# Patient Record
Sex: Female | Born: 1965 | Race: White | Hispanic: No | Marital: Married
Health system: Midwestern US, Academic
[De-identification: ages and names within clinical notes are randomized; demographics above are authoritative.]

---

## 2018-05-26 ENCOUNTER — Encounter: Admit: 2018-05-26 | Discharge: 2018-05-27 | Payer: Private Health Insurance - Indemnity | Primary: Family

## 2018-05-26 DIAGNOSIS — R69 Illness, unspecified: Principal | ICD-10-CM

## 2018-06-15 ENCOUNTER — Encounter: Admit: 2018-06-15 | Discharge: 2018-06-16 | Payer: Private Health Insurance - Indemnity | Primary: Family

## 2018-06-15 ENCOUNTER — Ambulatory Visit: Admit: 2018-06-15 | Discharge: 2018-06-15 | Payer: Private Health Insurance - Indemnity | Primary: Family

## 2018-06-15 ENCOUNTER — Encounter: Admit: 2018-06-15 | Discharge: 2018-06-15 | Payer: Private health insurance—other commercial Indemnity | Primary: Family

## 2018-06-15 DIAGNOSIS — R69 Illness, unspecified: Principal | ICD-10-CM

## 2018-06-22 ENCOUNTER — Encounter: Admit: 2018-06-22 | Discharge: 2018-06-22 | Payer: Private health insurance—other commercial Indemnity | Primary: Family

## 2018-06-22 DIAGNOSIS — C50912 Malignant neoplasm of unspecified site of left female breast: Principal | ICD-10-CM

## 2018-06-23 ENCOUNTER — Encounter: Admit: 2018-06-23 | Discharge: 2018-06-23 | Payer: Private health insurance—other commercial Indemnity | Primary: Family

## 2018-06-26 ENCOUNTER — Encounter: Admit: 2018-06-26 | Discharge: 2018-06-26 | Payer: Private Health Insurance - Indemnity | Primary: Family

## 2018-06-26 DIAGNOSIS — Z170 Estrogen receptor positive status [ER+]: ICD-10-CM

## 2018-06-26 DIAGNOSIS — C50112 Malignant neoplasm of central portion of left female breast: Principal | ICD-10-CM

## 2018-06-26 DIAGNOSIS — R69 Illness, unspecified: Principal | ICD-10-CM

## 2018-06-26 DIAGNOSIS — C50912 Malignant neoplasm of unspecified site of left female breast: ICD-10-CM

## 2018-06-29 ENCOUNTER — Encounter: Admit: 2018-06-29 | Discharge: 2018-06-29 | Payer: Private Health Insurance - Indemnity | Primary: Family

## 2018-06-29 DIAGNOSIS — C50919 Malignant neoplasm of unspecified site of unspecified female breast: Principal | ICD-10-CM

## 2018-06-30 ENCOUNTER — Ambulatory Visit: Admit: 2018-06-30 | Discharge: 2018-07-01 | Payer: Private health insurance—other commercial Indemnity | Primary: Family

## 2018-06-30 ENCOUNTER — Encounter: Admit: 2018-06-30 | Discharge: 2018-06-30 | Payer: Private Health Insurance - Indemnity | Primary: Family

## 2018-06-30 ENCOUNTER — Encounter: Admit: 2018-06-30 | Discharge: 2018-06-30 | Payer: Private health insurance—other commercial Indemnity | Primary: Family

## 2018-06-30 ENCOUNTER — Ambulatory Visit: Admit: 2018-06-30 | Discharge: 2018-06-30 | Payer: Private Health Insurance - Indemnity | Primary: Family

## 2018-06-30 DIAGNOSIS — Z01818 Encounter for other preprocedural examination: ICD-10-CM

## 2018-06-30 DIAGNOSIS — C50919 Malignant neoplasm of unspecified site of unspecified female breast: Principal | ICD-10-CM

## 2018-06-30 DIAGNOSIS — C50112 Malignant neoplasm of central portion of left female breast: Principal | ICD-10-CM

## 2018-06-30 DIAGNOSIS — G4700 Insomnia, unspecified: ICD-10-CM

## 2018-06-30 DIAGNOSIS — Z9189 Other specified personal risk factors, not elsewhere classified: ICD-10-CM

## 2018-06-30 DIAGNOSIS — C50912 Malignant neoplasm of unspecified site of left female breast: Principal | ICD-10-CM

## 2018-06-30 DIAGNOSIS — Z170 Estrogen receptor positive status [ER+]: ICD-10-CM

## 2018-06-30 DIAGNOSIS — J302 Other seasonal allergic rhinitis: ICD-10-CM

## 2018-06-30 DIAGNOSIS — M1990 Unspecified osteoarthritis, unspecified site: ICD-10-CM

## 2018-06-30 LAB — CBC AND DIFF
Lab: 0.1 10*3/uL (ref 0–0.20)
Lab: 0.1 10*3/uL (ref 0–0.45)
Lab: 0.8 10*3/uL (ref 0–0.80)
Lab: 14 g/dL (ref 12.0–15.0)
Lab: 2 10*3/uL (ref 1.8–7.0)
Lab: 3.7 10*3/uL (ref 1.0–4.8)
Lab: 30 pg — ABNORMAL HIGH (ref 26–34)
Lab: 4.8 M/UL (ref 4.0–5.0)
Lab: 43 % (ref 36–45)
Lab: 56 % — ABNORMAL HIGH (ref 24–44)
Lab: 90 FL (ref 80–100)

## 2018-06-30 LAB — COMPREHENSIVE METABOLIC PANEL
Lab: 139 MMOL/L (ref 137–147)
Lab: 3.8 MMOL/L (ref 3.5–5.1)

## 2018-06-30 NOTE — Progress Notes
Name: Patricia Montes          MRN: [id number]      DOB: 12-08-1965      AGE: 53 y.o.   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 is a caucasian female who presented to the [facility] on 06/30/2018 at age 53 for evaluation of left breast cancer. [name] had no complaints prior to her outside screening mammogram in March 2020. There was a new left breast mass in the central breast that was suspicious. This was suspicious on diagnostic imaging and biopsy was recommended. 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    BREAST IMAGING:  Mammogram:    -- Bilateral screening mammogram 05/26/18 ([place]) revealed scattered fibroglandular densities. On MLO view in the left breast at 12:00 there was a 6 mm asymmetric density lying roughly 3.5 cm FTN. This finding was not as conspicuous on CC view but still appeared to be present on tomographic images. The development of this density since the prior exam did make it worrisome for malignancy. Diagnostic imaging recommended. The right breast was unremarkable.  -- Left diagnostic mammogram 06/15/18 [facility]) revealed a persistent spiculated density in the upper left breast approximately 2-3 cm FTN. This was concerning for breast neoplasm. Further evaluation with ultrasound recommended.  -- Left diagnostic mammogram 06/30/18 ([place]) revealed fatty breast tissue. There was a 3.2 cm irregular spiculated mass in the 11:30 to 12:00

## 2018-06-30 NOTE — Patient Instructions
a snug sports bra until your post-operative appointment. Do NOT wear underwire bras.    SIGNS & SYMPTOMS  -Please contact your doctor if you have any of the following symptoms: temperature higher than 100 degrees F, uncontrolled pain, persistent nausea and/or vomiting, difficulty breathing or breast redness, leakage or incision breakdown/opening.     IF YOU HAVE A DRAIN  *WASH HANDS PRIOR TO ANY HANDLING OF DRAIN.   -Please write down daily (total for 24 hours) drain output and empty multiple times a day if needed or according to physician's instructions. Record the output in milliliters.  -There is a CHG drain dressing in place over the entry site of your drain. This is to remain in place until removed by your surgeon. There is an antimicrobial gel in the center rectangle of the dressing. This is normal and not a sign of fluid leaking. If there is physical liquid leaking on the skin outside the area of the drain, please notify your surgeons office.   -To empty the drain, open the lid on top of the bulb.  Pour the fluid into a measuring cup and record.  Close by squeezing the bulb until as much air as possible is removed, then cap the lid to recreate suction.   -Strip the drain twice daily or as needed by pinching the tubing near the skin with both hands.  Keep hand closest to you steady and with the other, slowly squeeze the liquid toward the bulb.  This will prevent any clots or debris from clogging the drain.   -It is okay to shower with the drain and dressing in place.  Pat the dressing dry after showering.    *When showering consider placing your back to the water and letting the water run over the surgical areas. Soap and water contact over the surgical sites is OK. Please do not soak or scrub the sites.     *A lanyard can be used to hook the drains around your neck to allow freedom of hands for showering.    -Please bring the drain record to your next clinic appointment. try taking additional laxatives.    *Magnesium Citrate (1-2 bottles in 24 hour period). Do not take if you have kidney issues or are on dialysis.       For questions or concerns regarding your hospital stay:  - DURING BUSINESS HOURS (8:00 AM - 4:30 PM):    Call [phone] to speak to your surgeon???s nurse    - AFTER BUSINESS HOURS (4:30 PM - 8:00 AM, on weekends, or holidays):  Call [phone] and ask to page the BREAST SURGERY team      IF A PLASTIC SURGEON WAS INVOLVED IN YOUR SURGERY:    - DURING BUSINESS HOURS (8:00 AM - 4:30 PM):    Call the office at [phone]    - AFTER BUSINESS HOURS (4:30 PM - 8:00 AM, on weekends, or holidays):  Call [phone] and ask to page the PLASTIC SURGERY team    *If you had reconstruction with a plastic surgeon, all pain medication refills will be completed by their office.

## 2018-07-03 ENCOUNTER — Encounter: Admit: 2018-07-03 | Discharge: 2018-07-03 | Payer: Private Health Insurance - Indemnity | Primary: Family

## 2018-07-03 DIAGNOSIS — G4700 Insomnia, unspecified: ICD-10-CM

## 2018-07-03 DIAGNOSIS — M1990 Unspecified osteoarthritis, unspecified site: ICD-10-CM

## 2018-07-03 DIAGNOSIS — J302 Other seasonal allergic rhinitis: ICD-10-CM

## 2018-07-03 DIAGNOSIS — C50919 Malignant neoplasm of unspecified site of unspecified female breast: Principal | ICD-10-CM

## 2018-07-06 ENCOUNTER — Encounter: Admit: 2018-07-06 | Discharge: 2018-07-06 | Payer: Private health insurance—other commercial Indemnity | Primary: Family

## 2018-07-06 ENCOUNTER — Encounter: Admit: 2018-07-06 | Discharge: 2018-07-06 | Payer: Private Health Insurance - Indemnity | Primary: Family

## 2018-07-10 ENCOUNTER — Encounter: Admit: 2018-07-10 | Discharge: 2018-07-10 | Payer: Private health insurance—other commercial Indemnity | Primary: Family

## 2018-07-10 ENCOUNTER — Encounter: Admit: 2018-07-10 | Discharge: 2018-07-10 | Payer: Private Health Insurance - Indemnity | Primary: Family

## 2018-07-10 DIAGNOSIS — C50112 Malignant neoplasm of central portion of left female breast: Principal | ICD-10-CM

## 2018-07-10 DIAGNOSIS — M1990 Unspecified osteoarthritis, unspecified site: ICD-10-CM

## 2018-07-10 DIAGNOSIS — G4700 Insomnia, unspecified: ICD-10-CM

## 2018-07-10 DIAGNOSIS — J302 Other seasonal allergic rhinitis: ICD-10-CM

## 2018-07-10 DIAGNOSIS — C50912 Malignant neoplasm of unspecified site of left female breast: Principal | ICD-10-CM

## 2018-07-10 DIAGNOSIS — C50919 Malignant neoplasm of unspecified site of unspecified female breast: Principal | ICD-10-CM

## 2018-07-10 NOTE — Progress Notes
???   acetaminophen SR (TYLENOL) 650 mg tablet Take 1,300 mg by mouth at bedtime daily. Tylenol Arthritis   ??? cetirizine HCl/pseudoephedrine (ZYRTEC-D PO) Take  by mouth daily.   ??? cyclobenzaprine (FLEXERIL) 10 mg tablet Take 10 mg by mouth every 8 hours.   ??? diphenhydramine HCl (BENADRYL PO) Take  by mouth as Needed.   ??? FLINTSTONES MULTIVITAMIN PO Take  by mouth daily.   ??? fluticasone propionate (FLONASE) 50 mcg/actuation nasal spray, suspension Apply 1 spray into nose as directed every 24 hours.   ??? guaifenesin (MUCUS RELIEF PO) Take  by mouth daily.   ??? mv-min-vit C-Glu-Lys ac-hb124 (AIRBORNE (WITH LYSINE ACETATE)) 250-12.5 mg chew Chew  by mouth daily.   ??? traZODone (DESYREL) 50 mg tablet Take 50 mg by mouth at bedtime as needed.     Vitals:    07/10/18 1352   Temp: 36.9 ???C (98.5 ???F)   TempSrc: Tympanic   Weight: 84.4 kg (186 lb)   Height: 163.8 cm (64.5)   PainSc: Zero     Body mass index is 31.43 kg/m???.     Pain Score: Zero       Fatigue Scale: 0-None    Pain Addressed:  N/A    Patient Evaluated for a Clinical Trial: Patient not eligible for a treatment trial (including not needing treatment, needs palliative care, in remission).     [place] Cooperative Oncology Group performance status is 0, Fully active, able to carry on all pre-disease performance without restriction.Marland Kitchen     Physical Exam  Constitutional:       Appearance: Normal appearance.   HENT:      Head: Normocephalic and atraumatic.   Pulmonary:      Effort: Pulmonary effort is normal.   Neurological:      Mental Status: She is alert.   Psychiatric:         Mood and Affect: Mood normal.               Assessment and Plan:        Encounter Diagnoses   Name Primary?   ??? Malignant neoplasm of left female breast, unspecified estrogen receptor status, unspecified site of breast (HCC) Yes          Left breast cancer  I discussed discussed prognosis and treatment options of HER-2 positive breast cancer with running and her family I recommended neoadjuvant

## 2018-07-13 ENCOUNTER — Encounter: Admit: 2018-07-13 | Discharge: 2018-07-13 | Payer: Private health insurance—other commercial Indemnity | Primary: Family

## 2018-07-13 ENCOUNTER — Encounter: Admit: 2018-07-13 | Discharge: 2018-07-13 | Payer: Private Health Insurance - Indemnity | Primary: Family

## 2018-07-13 DIAGNOSIS — C50112 Malignant neoplasm of central portion of left female breast: Principal | ICD-10-CM

## 2018-07-13 MED ORDER — CEFAZOLIN INJ 1GM IVP
2 g | Freq: Once | INTRAVENOUS | 0 refills | Status: CN
Start: 2018-07-13 — End: ?

## 2018-07-14 ENCOUNTER — Encounter: Admit: 2018-07-14 | Discharge: 2018-07-14 | Payer: Private Health Insurance - Indemnity | Primary: Family

## 2018-07-14 DIAGNOSIS — M1990 Unspecified osteoarthritis, unspecified site: ICD-10-CM

## 2018-07-14 DIAGNOSIS — J302 Other seasonal allergic rhinitis: ICD-10-CM

## 2018-07-14 DIAGNOSIS — C50919 Malignant neoplasm of unspecified site of unspecified female breast: Principal | ICD-10-CM

## 2018-07-14 DIAGNOSIS — G4700 Insomnia, unspecified: ICD-10-CM

## 2018-07-15 ENCOUNTER — Encounter: Admit: 2018-07-15 | Discharge: 2018-07-15 | Payer: Private Health Insurance - Indemnity | Primary: Family

## 2018-07-15 ENCOUNTER — Ambulatory Visit: Admit: 2018-07-15 | Discharge: 2018-07-15 | Payer: Private health insurance—other commercial Indemnity | Primary: Family

## 2018-07-15 ENCOUNTER — Ambulatory Visit: Admit: 2018-07-15 | Discharge: 2018-07-15 | Payer: Private Health Insurance - Indemnity | Primary: Family

## 2018-07-15 ENCOUNTER — Encounter: Admit: 2018-07-15 | Discharge: 2018-07-15 | Payer: Private health insurance—other commercial Indemnity | Primary: Family

## 2018-07-15 DIAGNOSIS — Z170 Estrogen receptor positive status [ER+]: ICD-10-CM

## 2018-07-15 DIAGNOSIS — Z452 Encounter for adjustment and management of vascular access device: Principal | ICD-10-CM

## 2018-07-15 DIAGNOSIS — G4700 Insomnia, unspecified: ICD-10-CM

## 2018-07-15 DIAGNOSIS — J302 Other seasonal allergic rhinitis: ICD-10-CM

## 2018-07-15 DIAGNOSIS — C50112 Malignant neoplasm of central portion of left female breast: ICD-10-CM

## 2018-07-15 DIAGNOSIS — M1990 Unspecified osteoarthritis, unspecified site: ICD-10-CM

## 2018-07-15 DIAGNOSIS — C50919 Malignant neoplasm of unspecified site of unspecified female breast: Principal | ICD-10-CM

## 2018-07-15 MED ORDER — LIDOCAINE (PF) 10 MG/ML (1 %) IJ SOLN
.1-2 mL | Freq: Once | INTRAMUSCULAR | 0 refills | Status: CP
Start: 2018-07-15 — End: ?

## 2018-07-15 MED ORDER — LACTATED RINGERS IV SOLP
INTRAVENOUS | 0 refills | Status: DC
Start: 2018-07-15 — End: 2018-07-15
  Administered 2018-07-15: 14:00:00 1000 mL via INTRAVENOUS

## 2018-07-15 MED ORDER — ONDANSETRON HCL (PF) 4 MG/2 ML IJ SOLN
4 mg | Freq: Once | INTRAVENOUS | 0 refills | Status: DC | PRN
Start: 2018-07-15 — End: 2018-07-15

## 2018-07-15 MED ORDER — LIDOCAINE (PF) 200 MG/10 ML (2 %) IJ SYRG
0 refills | Status: DC
Start: 2018-07-15 — End: 2018-07-15
  Administered 2018-07-15: 14:00:00 50 mg via INTRAVENOUS

## 2018-07-15 MED ORDER — HALOPERIDOL LACTATE 5 MG/ML IJ SOLN
1 mg | Freq: Once | INTRAVENOUS | 0 refills | Status: DC | PRN
Start: 2018-07-15 — End: 2018-07-15

## 2018-07-15 MED ORDER — HYDROCODONE-ACETAMINOPHEN 5-325 MG PO TAB
1 | ORAL_TABLET | ORAL | 0 refills | 30.00000 days | Status: DC | PRN
Start: 2018-07-15 — End: 2018-11-25
  Filled 2018-07-15 (×2): qty 10, 2d supply, fill #1

## 2018-07-15 MED ORDER — FENTANYL CITRATE (PF) 50 MCG/ML IJ SOLN
25 ug | INTRAVENOUS | 0 refills | Status: DC | PRN
Start: 2018-07-15 — End: 2018-07-15

## 2018-07-15 MED ORDER — LIDOCAINE HCL 10 MG/ML (1 %) IJ SOLN
0 refills | Status: DC
Start: 2018-07-15 — End: 2018-07-15
  Administered 2018-07-15: 14:00:00 4.5 mL via INTRAMUSCULAR

## 2018-07-15 MED ORDER — CEFAZOLIN INJ 1GM IVP
2 g | Freq: Once | INTRAVENOUS | 0 refills | Status: DC
Start: 2018-07-15 — End: 2018-07-15

## 2018-07-15 MED ORDER — FENTANYL CITRATE (PF) 50 MCG/ML IJ SOLN
50 ug | INTRAVENOUS | 0 refills | Status: DC | PRN
Start: 2018-07-15 — End: 2018-07-15
  Administered 2018-07-15: 15:00:00 50 ug via INTRAVENOUS

## 2018-07-15 MED ORDER — MEPERIDINE (PF) 25 MG/ML IJ SYRG
12.5 mg | INTRAVENOUS | 0 refills | Status: DC | PRN
Start: 2018-07-15 — End: 2018-07-15

## 2018-07-15 MED ORDER — SODIUM CHLORIDE 0.9 % IV SOLP
0 refills | Status: CP
Start: 2018-07-15 — End: ?
  Administered 2018-07-15: 14:00:00 1000 mL

## 2018-07-15 MED ORDER — PROPOFOL 10 MG/ML IV EMUL 20 ML (INFUSION)(AM)(OR)
0 refills | Status: DC
Start: 2018-07-15 — End: 2018-07-15

## 2018-07-15 MED ORDER — FENTANYL CITRATE (PF) 50 MCG/ML IJ SOLN
50 ug | INTRAVENOUS | 0 refills | Status: DC | PRN
Start: 2018-07-15 — End: 2018-07-15

## 2018-07-15 MED ORDER — DIPHENHYDRAMINE HCL 50 MG/ML IJ SOLN
25 mg | Freq: Once | INTRAVENOUS | 0 refills | Status: DC | PRN
Start: 2018-07-15 — End: 2018-07-15

## 2018-07-15 MED ORDER — MIDAZOLAM 1 MG/ML IJ SOLN
INTRAVENOUS | 0 refills | Status: DC
Start: 2018-07-15 — End: 2018-07-15
  Administered 2018-07-15: 14:00:00 2 mg via INTRAVENOUS

## 2018-07-15 MED ORDER — OXYCODONE 5 MG PO TAB
5-10 mg | Freq: Once | ORAL | 0 refills | Status: DC | PRN
Start: 2018-07-15 — End: 2018-07-15

## 2018-07-15 MED ORDER — BUPIVACAINE 0.25 % (2.5 MG/ML) IJ SOLN
0 refills | Status: DC
Start: 2018-07-15 — End: 2018-07-15
  Administered 2018-07-15: 14:00:00 4.5 mL via INTRAMUSCULAR

## 2018-07-15 MED ORDER — HEPARIN (PORCINE) 1,000 UNIT/ML IJ SOLN
0 refills | Status: DC
Start: 2018-07-15 — End: 2018-07-15
  Administered 2018-07-15: 14:00:00 3000 [IU] via INTRAVENOUS

## 2018-07-15 MED ADMIN — LIDOCAINE (PF) 10 MG/ML (1 %) IJ SOLN [95838]: 0.1 mL | INTRAMUSCULAR | @ 14:00:00 | Stop: 2018-07-15 | NDC 63323049227

## 2018-07-15 NOTE — Anesthesia Post-Procedure Evaluation
Post-Anesthesia Evaluation    Name: Patricia Montes      MRN: [id number]     DOB: November 04, 1965     Age: 53 y.o.     Sex: female   __________________________________________________________________________     Procedure Date: 07/15/2018  Procedure(s) (LRB):  PLACEMENT OF PORT-A-CATH 8 FRENCH SINGLE-LUMEN POWERPORT (Right)  FLUOROSCOPIC GUIDANCE CENTRAL VENOUS ACCESS DEVICE PLACEMENT (Right)      Surgeon: Surgeon(s):  Berbel, German L, DO    Post-Anesthesia Vitals  BP: 139/95 (04/22 1030)  Temp: 36.3 ???C (97.3 ???F) (04/22 1030)  Pulse: 73 (04/22 1030)  Respirations: 16 PER MINUTE (04/22 1030)  SpO2: 96 % (04/22 1030)  SpO2 Pulse: 75 (04/22 1030)  Height: 162.6 cm (64) (04/22 0801)   Vitals Value Taken Time   BP 139/95 07/15/2018 10:30 AM   Temp 36.3 ???C (97.3 ???F) 07/15/2018 10:30 AM   Pulse 73 07/15/2018 10:30 AM   Respirations 16 PER MINUTE 07/15/2018 10:30 AM   SpO2 96 % 07/15/2018 10:30 AM         Post Anesthesia Evaluation Note    Evaluation location: pre/post  Patient participation: recovered; patient participated in evaluation  Level of consciousness: alert    Pain score: 2  Pain management: adequate    Hydration: normovolemia  Temperature: 36.0???C - 38.4???C  Airway patency: adequate    Perioperative Events       Post-op nausea and vomiting: no PONV    Postoperative Status  Cardiovascular status: hemodynamically stable  Respiratory status: spontaneous ventilation        Perioperative Events  Perioperative Event: No  Emergency Case Activation: No

## 2018-07-16 ENCOUNTER — Encounter: Admit: 2018-07-16 | Discharge: 2018-07-16 | Payer: Private Health Insurance - Indemnity | Primary: Family

## 2018-07-16 DIAGNOSIS — C50919 Malignant neoplasm of unspecified site of unspecified female breast: Principal | ICD-10-CM

## 2018-07-16 DIAGNOSIS — G4700 Insomnia, unspecified: ICD-10-CM

## 2018-07-16 DIAGNOSIS — M1990 Unspecified osteoarthritis, unspecified site: ICD-10-CM

## 2018-07-16 DIAGNOSIS — J302 Other seasonal allergic rhinitis: ICD-10-CM

## 2018-07-23 ENCOUNTER — Encounter: Admit: 2018-07-23 | Discharge: 2018-07-23 | Payer: Private Health Insurance - Indemnity | Primary: Family

## 2018-08-19 ENCOUNTER — Encounter: Admit: 2018-08-19 | Discharge: 2018-08-19 | Payer: Private Health Insurance - Indemnity | Primary: Family

## 2018-08-19 DIAGNOSIS — C50112 Malignant neoplasm of central portion of left female breast: Principal | ICD-10-CM

## 2018-08-19 DIAGNOSIS — Z170 Estrogen receptor positive status [ER+]: ICD-10-CM

## 2018-08-20 ENCOUNTER — Encounter: Admit: 2018-08-20 | Discharge: 2018-08-20 | Payer: Private Health Insurance - Indemnity | Primary: Family

## 2018-08-20 NOTE — Progress Notes
Certified Genetic Counselor  [facility]  [address] [place], [place]  [address]    Email: [email or web address]  Telephone [phone]  Fax:  [phone]  Appointments [phone]    Pending: Appointment to return to clinic will be pursued as appropriate.

## 2018-09-30 ENCOUNTER — Encounter: Admit: 2018-09-30 | Discharge: 2018-09-30 | Primary: Family

## 2018-09-30 DIAGNOSIS — C50112 Malignant neoplasm of central portion of left female breast: Secondary | ICD-10-CM

## 2018-09-30 DIAGNOSIS — G4700 Insomnia, unspecified: Secondary | ICD-10-CM

## 2018-09-30 DIAGNOSIS — M1990 Unspecified osteoarthritis, unspecified site: Secondary | ICD-10-CM

## 2018-09-30 DIAGNOSIS — C50919 Malignant neoplasm of unspecified site of unspecified female breast: Secondary | ICD-10-CM

## 2018-09-30 DIAGNOSIS — Z170 Estrogen receptor positive status [ER+]: Secondary | ICD-10-CM

## 2018-09-30 DIAGNOSIS — J302 Other seasonal allergic rhinitis: Secondary | ICD-10-CM

## 2018-09-30 NOTE — Progress Notes
Telehealth Visit Note    Date of Service: 09/30/2018    Subjective:      Obtained patient's verbal consent to treat them and their agreement to [facility] financial policy and NPP via this telehealth visit during the [facility] Emergency       Patricia Montes is a 53 y.o. female.    DIAGNOSIS:  Left grade 3 IDC (ER93%, PR41%, HER2 2+, FISH positive, Ki-67 65%) at 12:00, dx 05/2018 (markers were rerun at [facility] and [facility] was positive at [place] and negative on initial outside slides)    History of Present Illness    Patricia Montes returns to the clinic midway through neoadjuvant chemotherapy for surgical discussion.     HISTORY:  Patricia Montes is a caucasian female who presented to the [facility] on 06/30/2018 at age 58 for evaluation of left breast cancer. Patricia Montes had no complaints prior to her outside screening mammogram in March 2020. There was a new left breast mass in the central breast that was suspicious. This was suspicious on diagnostic imaging and biopsy was recommended. 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    BREAST IMAGING:  Mammogram:    -- Bilateral screening mammogram 05/26/18 ([place]) revealed scattered fibroglandular densities. On MLO view in the left breast at 12:00 there was a 6 mm asymmetric density lying roughly 3.5 cm FTN. This finding was not as conspicuous on CC view but still appeared to be present on tomographic images. The development of this density since the prior exam did make it worrisome for malignancy. Diagnostic imaging recommended. The right breast was unremarkable.  -- Left diagnostic mammogram 06/15/18 [facility]) revealed a persistent spiculated density in the upper left breast approximately 2-3 cm FTN. This was concerning for breast neoplasm. Further evaluation with ultrasound recommended.  -- Left diagnostic mammogram 06/30/18 ([place]) revealed fatty breast tissue. There was a 3.2 cm irregular spiculated mass in the 11:30 to 12:00  anterior right breast. This mass had associated calcifications and overlying skin and nipple retraction. The calcifications and mass measured up to 4.6 cm. Calcifications extended to approximately 9 mm posterior to the left nipple. Scattered calcifications in the posterior left breast on the spot magnification cc view are within the far inferior left breast on outside images and associated with postreduction change in this patient.    Ultrasound:    -- Left breast ultrasound 06/15/18 [facility]) revealed an irregular hypoechoic mass like region at 12:00, 2 cm FTN. This measured 1.9 x 0.9 x 1.5 cm. This did show internal vascularity. No other breast lesions. The largest node in the axilla measured 1.3 x 0.8 x 1.2 cm and did have a fatty hilum.  -- Targeted left breast ultrasound 06/30/18 ([place]) revealed at 12:00, 2 cm from the nipple, demonstrated a 1.8 x 0.9 x 2.3 cm mass which extended to 0.4 cm from the left nipple. This corresponds to biopsy-proven malignancy. The tissue marker clip was visible at the margin of the mass. No suspicious left axillary lymph nodes were seen. 4 morphologically normal left axillary lymph nodes were identified.    REPRODUCTIVE HEALTH:  Age at first Menarche: 75   Age at First Live Birth:  56  Age at Menopause:  Postmenopausal s/p hysterectomy  Gravida:  4  Para: 3  Breastfeeding:  Yes    PROCEDURE: Bilateral breast reduction  PERTINENT PMH:  Seasonal allergies, passive smoke exposure  FAMILY HISTORY:  Maternal great grandmother with breast cancer at  82  PHYSICAL EXAM on PRESENTATION:  Left - 4 cm palpable mass at 12:00, nipple retraction that is exacerbated in the seated position with arms in flexion. Right - No palpable breast masses. No skin, nipple, or areolar change. No supraclavicular or axillary adenopathy.  MEDICAL ONCOLOGY:  Dr. Lysle Morales NEOADJUVANT THERAPY: [facility] scheduled to finish 11/09/18 REFERRED BY:  Dr. Nathanial Millman         Review of Systems    Constitutional: Negative for fever, chills, appetite change and fatigue.   HENT: Negative for hearing loss, congestion, rhinorrhea and tinnitus.    Eyes: Negative for pain, discharge and itching.   Respiratory: Negative for cough, chest tightness and shortness of breath.    Cardiovascular: Negative for chest pain and palpitations.   Gastrointestinal: Negative for abdominal distention, pain, nausea, vomiting, and diarrhea.   Genitourinary: Negative for frequency, vaginal bleeding, difficulty urinating and pelvic pain.   Musculoskeletal: Negative for myalgias, back pain, joint swelling and arthralgias.   Skin: Negative for rash.   Neurological: Negative for dizziness, weakness, light-headedness and headaches.   Hematological: Does not bruise/bleed easily.   Psychiatric/Behavioral: Negative for disturbed wake/sleep cycle. The patient is not nervous/anxious.    No Known Allergies    Medical History:   Diagnosis Date   ??? Breast cancer (HCC) 06/15/2018    left IDC   ??? Insomnia    ??? Osteoarthritis    ??? Seasonal allergies      Surgical History:   Procedure Laterality Date   ??? CLAVICLE SURGERY  1992   ??? PLACEMENT OF PORT-A-CATH 8 FRENCH SINGLE-LUMEN POWERPORT Right 07/15/2018    Performed by Janine Ores L, DO at [facility]   ??? FLUOROSCOPIC GUIDANCE CENTRAL VENOUS ACCESS DEVICE PLACEMENT Right 07/15/2018    Performed by Dellia Cloud, DO at [facility]   ??? HX BREAST REDUCTION     ??? HX HYSTERECTOMY       Family History   Problem Relation Age of Onset   ??? Unknown to Patient Mother    ??? Heart Disease Father    ??? High Cholesterol Father    ??? Seizures Brother    ??? Arthritis-rheumatoid Maternal Aunt    ??? Cancer Maternal Uncle         non Hodgkins   ??? Arthritis-rheumatoid Maternal Grandmother    ??? Cancer-Breast Other 82        mat great gma   ??? Heart Disease Other    ??? Arthritis-rheumatoid Other    ??? High Cholesterol Other      Social History     Socioeconomic History ??? Marital status: Married     Spouse name: Not on file   ??? Number of children: Not on file   ??? Years of education: Not on file   ??? Highest education level: Not on file   Occupational History   ??? Not on file   Tobacco Use   ??? Smoking status: Passive Smoke Exposure - Never Smoker   ??? Smokeless tobacco: Never Used   Substance and Sexual Activity   ??? Alcohol use: Not Currently     Frequency: Never   ??? Drug use: Not Currently   ??? Sexual activity: Not on file   Other Topics Concern   ??? Not on file   Social History Narrative   ??? Not on file           Objective:         ??? acetaminophen SR (TYLENOL) 650 mg tablet  Take 1,300 mg by mouth at bedtime daily. Tylenol Arthritis   ??? cetirizine (ZYRTEC) 10 mg tablet Take 10 mg by mouth every morning.   ??? cyclobenzaprine (FLEXERIL) 10 mg tablet Take 10 mg by mouth every 8 hours.   ??? dexAMETHasone (DECADRON) 4 mg tablet    ??? diphenhydrAMINE (BENADRYL ALLERGY) 25 mg tablet Take 25 mg by mouth every 6 hours as needed.   ??? FLINTSTONES MULTIVITAMIN PO Take 2 tablets by mouth daily.   ??? fluticasone propionate (FLONASE) 50 mcg/actuation nasal spray, suspension Apply 1 spray into nose as directed every 24 hours.   ??? guaiFENesin LA (MUCINEX) 600 mg tablet Take 600 mg by mouth twice daily as needed.   ??? HYDROcodone/acetaminophen (NORCO) 5/325 mg tablet Take one tablet by mouth every 4 hours as needed for Pain   ??? lisinopriL (ZESTRIL) 5 mg tablet TK 1 TABLET PO DAILY   ??? mv-min-vit C-Glu-Lys ac-hb124 (AIRBORNE (WITH LYSINE ACETATE)) 250-12.5 mg chew Chew  by mouth daily.   ??? ondansetron (ZOFRAN) 8 mg tablet    ??? pantoprazole DR (PROTONIX) 20 mg tablet Take 20 mg by mouth daily.   ??? traZODone (DESYREL) 50 mg tablet Take 50 mg by mouth at bedtime as needed.     Vitals:    09/30/18 1020   BP: 116/61   BP Source: Arm, Left Upper   Patient Position: Sitting   Temp: 37.1 ???C (98.8 ???F)   TempSrc: Tympanic   Weight: 87.5 kg (193 lb)   Height: 162.6 cm (64)   PainSc: Zero Body mass index is 33.13 kg/m???.     Physical Exam    General: Well-groomed female. Sitting in chair in living room. She is able to move without difficulty. Facial movements are symmetric, no rashes noted. She appears to be in no acute distress. Eyes: No discharge or scleral icterus. Pulmonary: No respiratory distress. Psychiatric: Normal mood affect, memory recent and remote normal. Neuro: nonfocal. 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 Lysle Morales. She reports she has been tolerating chemotherapy without complaints. She started on 07/27/18 and is estimated to finish on 11/09/18. She reports having staging studies and an MRI prior to starting chemotherapy. She thought it was an abdominal MRI. I do not see any mention of scans or an MRI in Dr. Carey Bullocks notes, so I will contact his office for further follow up. When Patricia Montes met with Dr. Loreta Ave she wanted to move forward with mastectomy and was leaning towards no reconstruction. After more thought, she has decided she would like to move forward with reconstruction. We will plan for a left skin sparing mastectomy. She is not a nipple sparing candidate as her tumor was extending to the nipple, causing inversion and retraction. The details of skin sparing mastectomy with reconstruction were discussed including removal of the NAC and paresthesia. We discussed that reconstruction is a phased process that includes multiple surgeries. We will start the reconstruction process at the time of mastectomy. Patricia Montes is leaning towards DIEP reconstruction, so likely a tissue expander will be placed with her mastectomy. Dr. Loreta Ave had discussed that she may need PMR T pending her final pathology because her tumor was close to 5 cm.  I will wait to refer her to radiation oncology until we confirm that she will actually need radiation. A left SLNB at the time of mastectomy is recommended to evaluate for nodal metastasis and an ALND if a SLN is found to be positive. We discussed the risk of lymphedema associated with nodal surgery. She will be seen in the Lymphedema Clinic pre op for education. Patricia Montes asked if she would need any end of treatment imaging. Since she is having a mastectomy, I do not believe she will need any imaging, but will confirm with Dr. Loreta Ave. If she has not had a pre treatment breast MRI, I do not think this will be beneficial to perform at the end of treatment as it will not change our surgical plans. Patricia Montes is to follow up with Dr. Loreta Ave at the completion of chemotherapy for surgical discussion. She was given ample time to ask questions all of which were answered to her satisfaction. She was encouraged to call with any interval questions or concerns.    1. Plan for left skin sparing mastectomy/SLNB/pALND/recon with PRS  2. Continue follow up with Dr. Lysle Morales  3. Follow up with Dr. Lysle Morales about any imaging done  4. Possible PMR T - will refer to Dr. Izola Price post op if needed  5. Lymphedema telehealth consult for education  6. Follow up with Dr. Loreta Ave at completion of chemotherapy    Guy Begin, PA-C                                   35 minutes spent on this patient's encounter with counseling and coordination of care taking >50% of the visit.

## 2018-09-30 NOTE — Progress Notes
Valerie gave verbal report of self obtained vital signs prior to Telehealth appointment. Medications, allergies, history and screening questions reviewed with patient. Patient confirms she received the financial policy, consent to treat and notice of privacy policies and verbally agrees to the policies they contain.

## 2018-10-07 ENCOUNTER — Encounter: Admit: 2018-10-07 | Discharge: 2018-10-07 | Primary: Family

## 2018-10-07 DIAGNOSIS — Z170 Estrogen receptor positive status [ER+]: Secondary | ICD-10-CM

## 2018-10-07 DIAGNOSIS — C50112 Malignant neoplasm of central portion of left female breast: Secondary | ICD-10-CM

## 2018-10-07 NOTE — Progress Notes
Obtained patient's verbal consent to treat them and their agreement to [facility] financial policy and NPP via this telehealth visit during the [facility] Emergency    Patient is completing Neoadjuvant chemotherapy, with anticipated surgery Left Mastectomy/SLNB date of December 09, 2018.   We discussed This would put her in the 'Low RISK' category for developing Lymphedema    LYMPHEDEMA DATASHEET-BASELINE    DATE:  10/07/2018    PATIENT NAME:   Patricia Montes  DATE OF BIRTH:   [birth year]  MRN:   [id number]    Enrollment Visit Type:  Pre-Operative    BASELINE DIAGNOSTIC ASSESSMENT      BASELINE MEASUREMENTS  BIS = 3.5 LEFT (06/30/18)    Handedness:  right handed    Medical Team:   Surgeon: Loreta Ave  Plastic surgeon: tbd  Medical oncologist: Elia/Mathew  Radiation oncologist: tbd    The patient presents today for pre-operative lymphedema education/evaluation.     Risk factors for the development of breast cancer treatment related lymphedema include:   1.  Left mastectomy lumpectomy   2.  Left SLNBX/pALND   3. Radiation Therapy  4. Chemotherapy   5. BMI > 30   6. Other medical comorbidity:     Lymphedema evaluation, teaching and care plan:     1. General review of Lymphedema pathophysiology in breast cancer patients. Patient reports no previous history or experience/personal knowledge of lymphedema. No barriers to learning noted, patient willing and active participant.  Very interested in learning about topic, prevention.  Thoughtful questions demonstrated good insight into etiology and prevention.      2. Baseline assessment and measurements completed including:  L-Dex Bioimpedence test (SOZO),     3. Signs and Symptoms to watch for once initial symptoms from surgery have resolved reviewed including but not limited to:  achiness, tingling, discomfort, or increased warmth in the hand, arm, chest, breast, or underarm areas. Feelings of fullness or heaviness in the hand, arm, chest, breast, or underarm. Tightness or decreased flexibility in nearby joints, such as the shoulder, hand, or wrist. Trouble fitting the arm into a jacket or shirt sleeve that fit well before. Difficulty getting watches, rings, or bracelets on and off    4. Risk Factors:  BMI, Obesity and weight gain.  Optimal weight management is ideal for overall health. Discussed current BMI.States with chemotherapy, not tasting foods, but does taste sugar. Discussed nutrition is important for healing.  Nutritional resources available if needed.  Exercise when approved by surgeon maybe resumed.  Exercise is beneficial as well as the benefit of upper body strengthening.  Precautions regarding meticulous skin care reviewed.  This includes but not limited to sunscreen, burns, gardening, manicures, cuts, scrapes, signs of infection.  Avoidance of affect arm  needlestick's, blood pressures, finger sticks and other applicable prevention discussion completed.     5. When to call the office:  Sudden swelling or swelling that appears very quickly. Hand, arm, or other body parts seem to suddenly ???blow up??? may necessitate an evaluation.     6. RTC : Routine follow up with BIS surveillance. BIS to be coordinated with routine surgical follow ups.  Written handouts regarding covered material provided via e-mail prior to today's visit.    Total time 46 minutes.TIME in 9:20 Time out 1008 Estimated counseling time 40 minutes regarding signs symptoms, risk factors, monitoring, when to contact us and follow up plan.  Patient agreeable to follow up plan.     Provided patient with contact information for  follow up should questions or concerns arise.

## 2018-10-21 ENCOUNTER — Encounter: Admit: 2018-10-21 | Discharge: 2018-10-21 | Primary: Family

## 2018-10-21 DIAGNOSIS — C50112 Malignant neoplasm of central portion of left female breast: Secondary | ICD-10-CM

## 2018-10-21 NOTE — Telephone Encounter
Called patient to give her plastic surgery consult date with Dr. Rolanda Jay of 11/09/18 at 10:15am at [facility] and surgery date of 12/09/18 with Dr. Earleen Newport and Dr. Rolanda Jay. Patient is very happy with both of these dates. Patient inquires about getting genetic testing completed before surgery to ensure her surgical plan doesn't need to change. Educated patient that orders were already in for the genetic testing and that she could call Pilar Jarvis, who was her genetic counselor she met with in May 2020, to let her know that she'd like to proceed with testing. Discussed that if she had the testing completed soon, there would be plenty of time for results before surgery. Patient verbalizes understanding.  All questions answered. Patient given contact information for additional questions or concerns.

## 2018-10-22 NOTE — Progress Notes
Surgery is planned for 12-09-18   Request will be sent to see her for a 2 week post op.

## 2018-11-06 ENCOUNTER — Encounter: Admit: 2018-11-06 | Discharge: 2018-11-06 | Primary: Family

## 2018-11-06 NOTE — Progress Notes
Obtained patient's verbal consent to treat them and their agreement to [place] and NPP via this telehealth visit during the [facility] Emergency    Patricia Montes was called today on her request to follow up on her decision to pursue genetic testing.     Plan:  Blood will be drawn on 11-10-2018 and sent to [facility] for CancerNext Panel: 36 genes with susceptibility to cancer including: APC, ATM, AXIN2, BARD1, BRCA1, BRCA2, BRIP1, BMPR1A, CDH1, CDK4, CDKN2A, CHEK2, DICER1, EPCAM, GREM1, HOXB13, MLH1, MSH2, MSH3, MSH6, MUTYH, NBN, NF1, NTHL1, PALB2, PMS2, POLD1, POLE, PTEN, RAD51C, RAD51D, RECQL, SMAD4, SMARCA4, STK11, and TP53. This panel includes genes associated with cancer of the uterus, ovary, breast, colon, as well as colon polyps and other genes conferring susceptibility to hereditary cancer. Full gene sequencing is performed for 32 genes (excluding EPCAM and GREM1). Added genes RINT1 and PRF163W. RNA transcripts are screened for 18 genes (APC, ATM, BRCA1, BRCA2, BRIP1, CDH1, CHEK2, MLH1, MSH2, MSH6, MUTYH, NF1, PALB2,PMS2 exons 1-10, PTEN, RAD51C, RAD51D, and TP53) and compared to a human reference pool. The absence or presence of RNA transcriptsmeeting quality thresholds are incorporated as evidence towards assessment and classification of DNA variants. Any regions not meeting RNAquality thresholds are excluded from analysis.    Obtained verbal consent for the test, and discussed insurance and Out of pocket information.     Hassan Rowan, MS, [facility]   Certified Genetic Counselor  [place] [facility]  [address] [place], [place]  [place], [place]  [address]    Email: [email or web address]  Telephone [phone]  Fax:  [phone]  Appointments [phone]    Pending: Follow up in 3-4 weeks to discuss genetic test results.

## 2018-11-10 ENCOUNTER — Encounter: Admit: 2018-11-10 | Discharge: 2018-11-10 | Payer: Private Health Insurance - Indemnity | Primary: Family

## 2018-11-10 ENCOUNTER — Ambulatory Visit: Admit: 2018-11-10 | Discharge: 2018-11-10 | Payer: Private Health Insurance - Indemnity | Primary: Family

## 2018-11-10 DIAGNOSIS — Z170 Estrogen receptor positive status [ER+]: Secondary | ICD-10-CM

## 2018-11-10 DIAGNOSIS — C50112 Malignant neoplasm of central portion of left female breast: Secondary | ICD-10-CM

## 2018-11-17 ENCOUNTER — Encounter: Admit: 2018-11-17 | Discharge: 2018-11-17 | Primary: Family

## 2018-11-17 DIAGNOSIS — M1990 Unspecified osteoarthritis, unspecified site: Secondary | ICD-10-CM

## 2018-11-17 DIAGNOSIS — Z170 Estrogen receptor positive status [ER+]: Secondary | ICD-10-CM

## 2018-11-17 DIAGNOSIS — Z01818 Encounter for other preprocedural examination: Principal | ICD-10-CM

## 2018-11-17 DIAGNOSIS — J302 Other seasonal allergic rhinitis: Secondary | ICD-10-CM

## 2018-11-17 DIAGNOSIS — G4700 Insomnia, unspecified: Secondary | ICD-10-CM

## 2018-11-17 DIAGNOSIS — C50112 Malignant neoplasm of central portion of left female breast: Secondary | ICD-10-CM

## 2018-11-17 DIAGNOSIS — C50919 Malignant neoplasm of unspecified site of unspecified female breast: Secondary | ICD-10-CM

## 2018-11-17 LAB — CBC AND DIFF
Lab: 0 10*3/uL (ref 0–0.20)
Lab: 3.6 M/UL — ABNORMAL LOW (ref 4.0–5.0)
Lab: 9.7 10*3/uL (ref 4.5–11.0)

## 2018-11-17 NOTE — Progress Notes
Spoke with Ms. Younkins regarding L SSM IOLM SLNB pALND and the confirmed surgery date of 12/09/18.  The patient was informed that she would receive a call from the surgery department the day prior to surgery to confirm time of arrival.  The patient was given detailed instructions about where and when to check in the day of surgery.    A pre op packet describing the surgical procedure and pre and post operative instructions has been given to the patient and reviewed in detail. Informed pt that we will see her 1-2 weeks post op to review final pathology report in detail and assess surgical recovery.     The patient has been informed of the medications that need to be stopped 7-10 days prior to surgery and the NPO requirements starting at midnight the night before surgery. Also informed that a driver will need to accompany pt due to the influence of anesthesia including narcotics post procedure. Informed pt that the PAT department will call to discuss lab work, medication review, health history, anesthesia/ surgical history, and any other additional recommended testing for clearance for surgery.     The patient verbalized understanding of the information given.  The patient was encouraged to call with any questions or concerns.

## 2018-11-17 NOTE — Progress Notes
Bioimpedance Spectroscopy performed.  Advised patient that Normal result will be sent within 24 hours by mail or via Mychart (preferred).  The patient will be contacted via phone by the lymphedema nurse with any abnormal results.

## 2018-11-17 NOTE — Progress Notes
BIS obtained for Post Neo Adjuvant Chemotherapy measurements prior to surgery.  (NC)  BIS =  L-Dex:4.1 LEFT  Baseline3.5  Change from Baseline:0.6  No notification indicated

## 2018-11-18 ENCOUNTER — Encounter: Admit: 2018-11-18 | Discharge: 2018-11-18 | Primary: Family

## 2018-11-18 DIAGNOSIS — C50112 Malignant neoplasm of central portion of left female breast: Secondary | ICD-10-CM

## 2018-11-18 DIAGNOSIS — Z1159 Encounter for screening for other viral diseases: Secondary | ICD-10-CM

## 2018-11-18 LAB — COMPREHENSIVE METABOLIC PANEL
Lab: 0.4 mg/dL (ref 0.3–1.2)
Lab: 1 mg/dL — ABNORMAL HIGH (ref 0.4–1.00)
Lab: 104 MMOL/L (ref 98–110)
Lab: 139 MMOL/L — ABNORMAL LOW (ref 137–147)
Lab: 18 U/L (ref 7–40)
Lab: 27 MMOL/L (ref 21–30)
Lab: 27 U/L (ref 7–56)
Lab: 3.9 MMOL/L — ABNORMAL LOW (ref 3.5–5.1)
Lab: 35 mg/dL — ABNORMAL HIGH (ref 7–25)
Lab: 4.3 g/dL (ref 3.5–5.0)
Lab: 53 mL/min — ABNORMAL LOW (ref >60)
Lab: 7.3 g/dL (ref 6.0–8.0)
Lab: 8 K/UL (ref 3–12)
Lab: 83 U/L (ref 25–110)
Lab: 9.5 mg/dL (ref 8.5–10.6)
Lab: >60 mL/min (ref >60)

## 2018-11-18 MED ORDER — CEFAZOLIN INJ 1GM IVP
2 g | Freq: Once | INTRAVENOUS | 0 refills | Status: CN
Start: 2018-11-18 — End: ?

## 2018-11-20 ENCOUNTER — Encounter: Admit: 2018-11-20 | Discharge: 2018-11-20 | Primary: Family

## 2018-11-23 ENCOUNTER — Encounter: Admit: 2018-11-23 | Discharge: 2018-11-23 | Primary: Family

## 2018-11-23 ENCOUNTER — Ambulatory Visit: Admit: 2018-11-23 | Discharge: 2018-11-24 | Primary: Family

## 2018-11-23 DIAGNOSIS — J302 Other seasonal allergic rhinitis: Secondary | ICD-10-CM

## 2018-11-23 DIAGNOSIS — C50919 Malignant neoplasm of unspecified site of unspecified female breast: Secondary | ICD-10-CM

## 2018-11-23 DIAGNOSIS — M1990 Unspecified osteoarthritis, unspecified site: Secondary | ICD-10-CM

## 2018-11-23 DIAGNOSIS — G4700 Insomnia, unspecified: Secondary | ICD-10-CM

## 2018-11-23 DIAGNOSIS — Z421 Encounter for breast reconstruction following mastectomy: Secondary | ICD-10-CM

## 2018-11-23 DIAGNOSIS — C50112 Malignant neoplasm of central portion of left female breast: Secondary | ICD-10-CM

## 2018-11-23 NOTE — Progress Notes
Date of Service: 11/23/2018                    History of Present Illness  Patricia Montes is a 53 y.o. female who presented to the [facility] on 06/30/2018 for evaluation of left breast cancer. She felt a left breast mass in January, thought it was a bruised.  On her outside screening mammogram in March 2020. There was a new left breast mass in the central breast that was suspicious. This was suspicious on diagnostic imaging and biopsy was recommended. 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  She had breast reduction surgery done 18 years ago.  Healed well, left with some size discrepancy with the left larger.  She a C or D cup size.  She is to undergo a left mastectomy.  ???  Age at first Menarche: 66   Age at First Live Birth:  44  Age at Menopause:  Postmenopausal s/p hysterectomy  Gravida:  4  Para: 3  Breastfeeding:  Yes  ???  Seasonal allergies, passive smoke exposure-husband is a smoker.  Maternal great grandmother with breast cancer at 40  Dr. Lysle Morales- [facility] finished 11/09/18  REFERRED BY:  Dr. Nathanial Millman       Review of Systems   Constitutional: Negative.    HENT: Negative.    Eyes: Negative.    Respiratory: Negative.    Cardiovascular: Negative.    Gastrointestinal: Negative.    Endocrine: Negative.    Genitourinary: Negative.    Musculoskeletal: Negative.    Skin: Negative.    Allergic/Immunologic: Positive for environmental allergies.   Neurological: Positive for numbness.   Hematological: Negative.    Psychiatric/Behavioral: Negative.        Medical History:   Diagnosis Date   ??? Breast cancer (HCC) 06/15/2018    left IDC   ??? Insomnia    ??? Osteoarthritis    ??? Seasonal allergies         Surgical History:   Procedure Laterality Date   ??? CLAVICLE SURGERY  1992   ??? PLACEMENT OF PORT-A-CATH 8 FRENCH SINGLE-LUMEN POWERPORT Right 07/15/2018    Performed by Janine Ores L, DO at [facility]   ??? FLUOROSCOPIC GUIDANCE CENTRAL VENOUS ACCESS DEVICE PLACEMENT Right 07/15/2018    Performed by Dellia Cloud, DO at [facility]   ??? HX BREAST REDUCTION     ??? HX HYSTERECTOMY          Family History   Problem Relation Age of Onset   ??? Unknown to Patient Mother    ??? Heart Disease Father    ??? High Cholesterol Father    ??? Seizures Brother    ??? Arthritis-rheumatoid Maternal Aunt    ??? Cancer Maternal Uncle         non Hodgkins   ??? Arthritis-rheumatoid Maternal Grandmother    ??? Cancer-Breast Other 82        mat great gma   ??? Heart Disease Other    ??? Arthritis-rheumatoid Other    ??? High Cholesterol Other         Social History     Socioeconomic History   ??? Marital status: Married     Spouse name: Not on file   ??? Number of children: Not on file   ??? Years of education: Not on file   ??? Highest education level: Not on file   Occupational History   ??? Not on file  Tobacco Use   ??? Smoking status: Passive Smoke Exposure - Never Smoker   ??? Smokeless tobacco: Never Used   Substance and Sexual Activity   ??? Alcohol use: Not Currently     Frequency: Never   ??? Drug use: Not Currently   ??? Sexual activity: Not on file   Other Topics Concern   ??? Not on file   Social History Narrative   ??? Not on file          Objective:         ??? acetaminophen SR (TYLENOL) 650 mg tablet Take 1,300 mg by mouth at bedtime daily. Tylenol Arthritis   ??? BENZONATATE PO Take  by mouth at bedtime as needed.   ??? cetirizine (ZYRTEC) 10 mg tablet Take 10 mg by mouth every morning.   ??? codeine/guaiFENesin (ROBITUSSIN-AC) 10/100 mg/5 mL oral solution Take 5 mL by mouth at bedtime as needed for Cough.   ??? cyclobenzaprine (FLEXERIL) 10 mg tablet Take 10 mg by mouth every 8 hours.   ??? dexAMETHasone (DECADRON) 4 mg tablet    ??? diphenhydrAMINE (BENADRYL ALLERGY) 25 mg tablet Take 25 mg by mouth every 6 hours as needed.   ??? FLINTSTONES MULTIVITAMIN PO Take 2 tablets by mouth daily.   ??? fluticasone propionate (FLONASE) 50 mcg/actuation nasal spray, suspension Apply 1 spray into nose as directed every 24 hours. ??? guaiFENesin LA (MUCINEX) 600 mg tablet Take 600 mg by mouth twice daily as needed.   ??? HYDROcodone/acetaminophen (NORCO) 5/325 mg tablet Take one tablet by mouth every 4 hours as needed for Pain   ??? lisinopriL (ZESTRIL) 5 mg tablet TK 1 TABLET PO DAILY   ??? mv-min-vit C-Glu-Lys ac-hb124 (AIRBORNE (WITH LYSINE ACETATE)) 250-12.5 mg chew Chew  by mouth daily.   ??? ondansetron (ZOFRAN) 8 mg tablet    ??? pantoprazole DR (PROTONIX) 20 mg tablet Take 20 mg by mouth daily.   ??? traZODone (DESYREL) 50 mg tablet Take 50 mg by mouth at bedtime as needed.     Vitals:    11/23/18 1443   BP: (!) 140/80   BP Source: Arm, Left Upper   Patient Position: Sitting   Pulse: 85   Temp: 36.5 ???C (97.7 ???F)   TempSrc: Temporal   SpO2: 98%   Weight: 88.9 kg (196 lb)   Height: 165.1 cm (65)   PainSc: Zero     Body mass index is 32.62 kg/m???.  Body surface area is 2.02 meters squared.          Physical Exam  Vitals signs and nursing note reviewed.   Constitutional:       Appearance: Normal appearance.   HENT:      Head: Normocephalic.   Chest:      Breasts: Breasts are asymmetrical (right 15% smaller than the left).         Right: Normal.         Left: No swelling, bleeding, inverted nipple, mass, nipple discharge, skin change or tenderness.      Comments: Modified wise pattern scars  Left base 19 cm and right base 18 cm  Grade 1  Shape very good  Abdominal:      Palpations: Abdomen is soft.      Hernia: There is no hernia in the ventral area.      Comments: Abdominoplasty scar  Upper abdominal upper adipose   Neurological:      General: No focal deficit present.      Mental Status:  She is alert and oriented to person, place, and time.   Psychiatric:         Mood and Affect: Mood normal.         Behavior: Behavior normal.           Assessment and Plan:      Breast reconstruction options were discussed with Patricia Montes including both autologous and implant based reconstruction. I explained that reconstruction is a process that can sometimes take up to a year or more, depending on any desired revisions, postoperative course, and patient schedule. There is an original surgery and then revisions spaced at least 3 months apart.   Implant based reconstruction was discussed. It was explained that either a tissue expander or implant would be placed on top of the pectoralis major muscle with either an ADM or other material for support of the implant based reconstruction. If the tissue expander is used, expansion would begin after two weeks and continue weekly depending on the patient???s comfort level, skin tolerance and in absence of any infection, extrusion or wounds. Once the expansion process was complete, saline or silicone implants would be placed and scar releases and breast mound revisions done as needed. This would be an outpatient procedure. She was told implants would have lifetime maintenance, including monitoring and eventual exchange. She was made aware of the benefits and risks associated with both saline and silicone implants including long term capsular contracture, rupture, infection, extrusion and ALCL. She was aware that all implants exhibit variable degrees of rippling and will at some point leak.  Silicone gel implants may have a silent rupture while saline implants ruptures become apparent due to deflation. Textured silicone gel implants do carry the risk of anaplastic large cell lymphoma.  Patient was shown examples of TE???s, saline and silicone implants. Risks associated with implant based reconstruction include but are not limited to wound infection, hematoma, seroma, fat necrosis, DVT, PE, scars, asymmetry, unacceptable cosmetic appearance, and other risks associated with general anesthesia.   We have decided to proceed with a plan for immediate reconstruction for her bilateral mastectomy with SSM incisions.  We will plan on using a tissue expander as she is a unilateral and there is a possibility of needing radiation treatment, along with ADM and the SPY system will be used for vascular flap assessment.    Once surgery is completed and decisions about PMRT and chemotherapy have been made we will have her talk with one of our microsurgeons.

## 2018-11-23 NOTE — Telephone Encounter
NO BARIATRIC BENEFITS ON PLAN   I BELIEVE PT IS COMINGAS A SELF PAY   LVM FOR PATIENT     Date verified:11/23/18  Insurance Plan:UHC   ID [id number]  Group [id number]  Verified by (phone # if applicable):[phone] HARRI[place]  Reference #:[id number]  In Network for Facility:YES  Effective Date:01/23/18

## 2018-11-24 DIAGNOSIS — Z170 Estrogen receptor positive status [ER+]: Secondary | ICD-10-CM

## 2018-11-24 NOTE — Telephone Encounter
[facility]   Genetic Counseling  Date: 11/23/2018  Patricia Montes  Patricia Montes City# [id number]  DOB Nov 03, 1965    Patricia Montes, age 53 y.o., was called today for a discussion of the results of the genetic testing that was performed during our previous visit.     Discussion: During our last visit we ordered the CancerNext Panel: 36 genes with susceptibility to cancer including: APC, ATM, AXIN2, BARD1, BRCA1, BRCA2, BRIP1, BMPR1A, CDH1, CDK4, CDKN2A, CHEK2, DICER1, EPCAM, GREM1, HOXB13, MLH1, MSH2, MSH3, MSH6, MUTYH, NBN, NF1, NTHL1, PALB2, PMS2, POLD1, POLE, PTEN, RAD51C, RAD51D, RECQL, SMAD4, SMARCA4, STK11, and TP53. This panel includes genes associated with cancer of the uterus, ovary, breast, colon, as well as colon polyps and other genes conferring susceptibility to hereditary cancer. Full gene sequencing is performed for 32 genes (excluding EPCAM and GREM1). Added genes RINT1 and [id number]. RNA transcripts are screened for 18 genes (APC, ATM, BRCA1, BRCA2, BRIP1, CDH1, CHEK2, MLH1, MSH2, MSH6, MUTYH, NF1, PALB2,PMS2 exons 1-10, PTEN, RAD51C, RAD51D, and TP53) and compared to a human reference pool. The absence or presence of RNA transcriptsmeeting quality thresholds are incorporated as evidence towards assessment and classification of DNA variants. Any regions not meeting RNAquality thresholds are excluded from analysis.    Patricia Montes's testing was NEGATIVE, which means she was not found to have a mutation in any of these genes. Testing was ordered for Patricia Montes based on her personal diagnosis of breast cancer.    Patricia Montes's testing did not find a mutation in any of the known genes associated with hereditary cancer syndromes. This means that at this time we cannot explain the cause for Patricia Montes's breast cancer. Patricia Montes's blood relatives are recommended to undergo age appropriate cancer screening.    It is still possible that Patricia Montes's children could have inherited a hereditary cancer syndrome from their father's family. If there is a paternal family history of cancer we recommend they consider seeing a Dentist to review the family history and determine if genetic testing is appropriate.       Plan: Patricia Montes is to reach out to our clinic in 3-4 years to determine if there are newer genes for breast cancer susceptibility available for testing.     Time: 15 minutes. Discussed test results and implications for family members. Determined who in the family still should consider pursuing genetic testing.     Patricia Palmer, MS, [facility]  Certified Genetic Counselor  Patricia Montes of [facility]  [address] [place], [place] [address]    Email: [email or web address]  Telephone [phone]  Fax: [phone]  Appointments [phone]

## 2018-11-25 ENCOUNTER — Encounter: Admit: 2018-11-25 | Discharge: 2018-11-25 | Primary: Family

## 2018-11-25 DIAGNOSIS — J302 Other seasonal allergic rhinitis: Secondary | ICD-10-CM

## 2018-11-25 DIAGNOSIS — G4700 Insomnia, unspecified: Secondary | ICD-10-CM

## 2018-11-25 DIAGNOSIS — M1990 Unspecified osteoarthritis, unspecified site: Secondary | ICD-10-CM

## 2018-11-25 DIAGNOSIS — C50919 Malignant neoplasm of unspecified site of unspecified female breast: Secondary | ICD-10-CM

## 2018-11-25 NOTE — Pre-Anesthesia Medication Instructions
YOUR MEDICATIONS    Current Medications    Medication Directions   acetaminophen SR (TYLENOL) 650 mg tablet Take 1,300 mg by mouth at bedtime daily. Tylenol Arthritis   cetirizine (ZYRTEC) 10 mg tablet Take 10 mg by mouth every morning.   cyclobenzaprine (FLEXERIL) 10 mg tablet Take 10 mg by mouth every 8 hours.   diphenhydrAMINE (BENADRYL ALLERGY) 25 mg tablet Take 25 mg by mouth every 6 hours as needed.   fluticasone propionate (FLONASE) 50 mcg/actuation nasal spray, suspension Apply 1 spray into nose as directed every 24 hours.   gabapentin 300 mg Tb24 Take 1 tablet by mouth at bedtime daily.   guaiFENesin LA (MUCINEX) 600 mg tablet Take 600 mg by mouth twice daily as needed.   lisinopriL (ZESTRIL) 5 mg tablet TK 1 TABLET PO DAILY   pantoprazole DR (PROTONIX) 20 mg tablet Take 20 mg by mouth at bedtime daily.   traZODone (DESYREL) 50 mg tablet Take 50 mg by mouth at bedtime as needed.       Before surgery  DO NOT take the day of surgery: Lisinopril, Mucinex, Benadryl    Morning of surgery  On the morning of surgery, take ONLY these medicines with a sip (1-2 ounces) of water:  Zyrtec  may use Flonase nasal spray  Tylenol (if needed)      Before going home from the hospital, please ask your doctor when you should re-start your medicines that were stopped before surgery.

## 2018-11-26 ENCOUNTER — Encounter: Admit: 2018-11-26 | Discharge: 2018-11-26 | Primary: Family

## 2018-12-07 ENCOUNTER — Encounter: Admit: 2018-12-06 | Discharge: 2018-12-07 | Primary: Family

## 2018-12-07 DIAGNOSIS — Z1159 Encounter for screening for other viral diseases: Principal | ICD-10-CM

## 2018-12-07 LAB — COVID-19 (SARS-COV-2) PCR

## 2018-12-08 ENCOUNTER — Encounter: Admit: 2018-12-08 | Discharge: 2018-12-08 | Payer: Private Health Insurance - Indemnity | Primary: Family

## 2018-12-08 MED ORDER — ACETAMINOPHEN 325 MG PO TAB
650 mg | ORAL_TABLET | ORAL | 0 refills | Status: CN
Start: 2018-12-08 — End: ?

## 2018-12-08 MED ORDER — OXYCODONE 5 MG PO TAB
5-10 mg | ORAL_TABLET | ORAL | 0 refills | Status: CN | PRN
Start: 2018-12-08 — End: ?

## 2018-12-08 MED ORDER — DIAZEPAM 5 MG PO TAB
5 mg | ORAL_TABLET | ORAL | 0 refills | Status: CN | PRN
Start: 2018-12-08 — End: ?

## 2018-12-08 MED ORDER — SENNOSIDES-DOCUSATE SODIUM 8.6-50 MG PO TAB
1 | ORAL_TABLET | Freq: Two times a day (BID) | ORAL | 1 refills | Status: CN
Start: 2018-12-08 — End: ?

## 2018-12-09 ENCOUNTER — Encounter: Admit: 2018-12-09 | Discharge: 2018-12-09 | Payer: Private Health Insurance - Indemnity | Primary: Family

## 2018-12-09 ENCOUNTER — Ambulatory Visit: Admit: 2018-12-09 | Discharge: 2018-12-09 | Payer: Private Health Insurance - Indemnity | Primary: Family

## 2018-12-09 MED ORDER — DEXTROSE 5%-0.45% SODIUM CHLORIDE & POTASSIUM CHLORIDE 20 MEQ/L IV SOLP
INTRAVENOUS | 0 refills | Status: DC
Start: 2018-12-09 — End: 2018-12-10
  Administered 2018-12-09 – 2018-12-10 (×2): 1000.000 mL via INTRAVENOUS

## 2018-12-09 MED ORDER — MORPHINE 4 MG/ML IV SYRG
2-4 mg | INTRAVENOUS | 0 refills | Status: DC | PRN
Start: 2018-12-09 — End: 2018-12-10

## 2018-12-09 MED ORDER — ACETAMINOPHEN 500 MG PO TAB
1000 mg | ORAL | 0 refills | Status: DC
Start: 2018-12-09 — End: 2018-12-10
  Administered 2018-12-09 – 2018-12-10 (×4): 1000 mg via ORAL

## 2018-12-09 MED ORDER — LACTATED RINGERS IV SOLP
INTRAVENOUS | 0 refills | Status: DC
Start: 2018-12-09 — End: 2018-12-10
  Administered 2018-12-09 (×2): 1000.000 mL via INTRAVENOUS

## 2018-12-09 MED ORDER — FAMOTIDINE (PF) 20 MG/2 ML IV SOLN
0 refills | Status: DC
Start: 2018-12-09 — End: 2018-12-09
  Administered 2018-12-09: 12:00:00 20 mg via INTRAVENOUS

## 2018-12-09 MED ORDER — ONDANSETRON HCL (PF) 4 MG/2 ML IJ SOLN
INTRAVENOUS | 0 refills | Status: DC
Start: 2018-12-09 — End: 2018-12-09
  Administered 2018-12-09: 15:00:00 4 mg via INTRAVENOUS

## 2018-12-09 MED ORDER — LIDOCAINE (PF) 200 MG/10 ML (2 %) IJ SYRG
0 refills | Status: DC
Start: 2018-12-09 — End: 2018-12-09
  Administered 2018-12-09: 12:00:00 60 mg via INTRAVENOUS

## 2018-12-09 MED ORDER — PROPOFOL 10 MG/ML IV EMUL 20 ML (INFUSION)(AM)(OR)
0 refills | Status: DC
Start: 2018-12-09 — End: 2018-12-09
  Administered 2018-12-09: 15:00:00 150 ug/kg/min via INTRAVENOUS

## 2018-12-09 MED ORDER — FENTANYL CITRATE (PF) 50 MCG/ML IJ SOLN
25 ug | INTRAVENOUS | 0 refills | Status: DC | PRN
Start: 2018-12-09 — End: 2018-12-09
  Administered 2018-12-09: 16:00:00 25 ug via INTRAVENOUS

## 2018-12-09 MED ORDER — DIPHENHYDRAMINE HCL 50 MG/ML IJ SOLN
12.5 mg | Freq: Once | INTRAVENOUS | 0 refills | Status: DC | PRN
Start: 2018-12-09 — End: 2018-12-09

## 2018-12-09 MED ORDER — CEFAZOLIN INJ 1GM IVP
2 g | INTRAVENOUS | 0 refills | Status: CP
Start: 2018-12-09 — End: ?
  Administered 2018-12-09 – 2018-12-10 (×2): 2 g via INTRAVENOUS

## 2018-12-09 MED ORDER — CYCLOBENZAPRINE 10 MG PO TAB
10 mg | ORAL | 0 refills | Status: CN
Start: 2018-12-09 — End: ?

## 2018-12-09 MED ORDER — PROMETHAZINE 25 MG/ML IJ SOLN
12.5-25 mg | INTRAVENOUS | 0 refills | Status: DC | PRN
Start: 2018-12-09 — End: 2018-12-10

## 2018-12-09 MED ORDER — OXYCODONE 5 MG PO TAB
5-10 mg | Freq: Once | ORAL | 0 refills | Status: DC | PRN
Start: 2018-12-09 — End: 2018-12-09

## 2018-12-09 MED ORDER — OXYCODONE 5 MG PO TAB
5-10 mg | ORAL | 0 refills | Status: DC | PRN
Start: 2018-12-09 — End: 2018-12-10
  Administered 2018-12-09 – 2018-12-10 (×4): 5 mg via ORAL

## 2018-12-09 MED ORDER — METOCLOPRAMIDE HCL 5 MG/ML IJ SOLN
10 mg | Freq: Once | INTRAVENOUS | 0 refills | Status: DC | PRN
Start: 2018-12-09 — End: 2018-12-09

## 2018-12-09 MED ORDER — LIDOCAINE (PF) 10 MG/ML (1 %) IJ SOLN
.1-2 mL | Freq: Once | INTRAMUSCULAR | 0 refills | Status: DC
Start: 2018-12-09 — End: 2018-12-09

## 2018-12-09 MED ORDER — GABAPENTIN 300 MG PO CAP
300 mg | Freq: Every evening | ORAL | 0 refills | Status: DC
Start: 2018-12-09 — End: 2018-12-10
  Administered 2018-12-10: 02:00:00 300 mg via ORAL

## 2018-12-09 MED ORDER — CEFAZOLIN INJ 1GM IVP
2 g | Freq: Once | INTRAVENOUS | 0 refills | Status: CP
Start: 2018-12-09 — End: ?
  Administered 2018-12-09: 12:00:00 2 g via INTRAVENOUS

## 2018-12-09 MED ORDER — HALOPERIDOL LACTATE 5 MG/ML IJ SOLN
1 mg | Freq: Once | INTRAVENOUS | 0 refills | Status: DC | PRN
Start: 2018-12-09 — End: 2018-12-09

## 2018-12-09 MED ORDER — PROPOFOL INJ 10 MG/ML IV VIAL
0 refills | Status: DC
Start: 2018-12-09 — End: 2018-12-09
  Administered 2018-12-09: 12:00:00 200 mg via INTRAVENOUS

## 2018-12-09 MED ORDER — FENTANYL CITRATE (PF) 50 MCG/ML IJ SOLN
0 refills | Status: DC
Start: 2018-12-09 — End: 2018-12-09
  Administered 2018-12-09 (×7): 50 ug via INTRAVENOUS

## 2018-12-09 MED ORDER — LISINOPRIL 5 MG PO TAB
5 mg | Freq: Every day | ORAL | 0 refills | Status: DC
Start: 2018-12-09 — End: 2018-12-10
  Administered 2018-12-10: 13:00:00 5 mg via ORAL

## 2018-12-09 MED ORDER — DIAZEPAM 5 MG PO TAB
5 mg | Freq: Once | ORAL | 0 refills | Status: CP
Start: 2018-12-09 — End: ?
  Administered 2018-12-09: 17:00:00 5 mg via ORAL

## 2018-12-09 MED ORDER — PROPOFOL 10 MG/ML IV EMUL 50 ML (INFUSION)(AM)(OR)
0 refills | Status: DC
Start: 2018-12-09 — End: 2018-12-09
  Administered 2018-12-09: 14:00:00 200 ug/kg/min via INTRAVENOUS

## 2018-12-09 MED ORDER — ISOSULFAN BLUE 1 % SC SOLN
0 refills | Status: DC
Start: 2018-12-09 — End: 2018-12-09
  Administered 2018-12-09: 13:00:00 2.5 mL via INTRAMUSCULAR

## 2018-12-09 MED ORDER — TRAZODONE 100 MG PO TAB
50 mg | Freq: Every evening | ORAL | 0 refills | Status: DC | PRN
Start: 2018-12-09 — End: 2018-12-10
  Administered 2018-12-10: 06:00:00 50 mg via ORAL

## 2018-12-09 MED ORDER — SODIUM CHLORIDE 0.9 % IR SOLN
0 refills | Status: DC
Start: 2018-12-09 — End: 2018-12-09
  Administered 2018-12-09: 15:00:00 1000 mL

## 2018-12-09 MED ORDER — PROPOFOL 10 MG/ML IV EMUL 100 ML (INFUSION)(AM)(OR)
0 refills | Status: DC
Start: 2018-12-09 — End: 2018-12-09
  Administered 2018-12-09: 12:00:00 175 ug/kg/min via INTRAVENOUS

## 2018-12-09 MED ORDER — FENTANYL CITRATE (PF) 50 MCG/ML IJ SOLN
50 ug | INTRAVENOUS | 0 refills | Status: DC | PRN
Start: 2018-12-09 — End: 2018-12-09
  Administered 2018-12-09: 16:00:00 50 ug via INTRAVENOUS

## 2018-12-09 MED ORDER — ONDANSETRON HCL (PF) 4 MG/2 ML IJ SOLN
4 mg | INTRAVENOUS | 0 refills | Status: DC | PRN
Start: 2018-12-09 — End: 2018-12-10

## 2018-12-09 MED ORDER — DIPHENHYDRAMINE HCL 50 MG/ML IJ SOLN
0 refills | Status: DC
Start: 2018-12-09 — End: 2018-12-09
  Administered 2018-12-09: 12:00:00 25 mg via INTRAVENOUS

## 2018-12-09 MED ORDER — RP DX TC-99M SULF COLL MCI
.5 | Freq: Once | INTRAVENOUS | 0 refills | Status: CP
Start: 2018-12-09 — End: ?
  Administered 2018-12-09: 12:00:00 0.5 via INTRAVENOUS

## 2018-12-09 MED ORDER — PANTOPRAZOLE 40 MG PO TBEC
40 mg | Freq: Every evening | ORAL | 0 refills | Status: DC
Start: 2018-12-09 — End: 2018-12-10
  Administered 2018-12-10: 02:00:00 40 mg via ORAL

## 2018-12-09 MED ORDER — ACETAMINOPHEN 500 MG PO TAB
1000 mg | Freq: Once | ORAL | 0 refills | Status: CP
Start: 2018-12-09 — End: ?
  Administered 2018-12-09: 12:00:00 1000 mg via ORAL

## 2018-12-09 MED ORDER — SODIUM CHLORIDE 0.9 % IV SOLP
0 refills | Status: CP
Start: 2018-12-09 — End: ?
  Administered 2018-12-09: 15:00:00 1000 mL

## 2018-12-09 MED ORDER — GENTAMICIN 40 MG/ML IJ SOLN
0 refills | Status: DC
Start: 2018-12-09 — End: 2018-12-09
  Administered 2018-12-09: 15:00:00 2 mL via TOPICAL

## 2018-12-09 MED ORDER — DEXAMETHASONE SODIUM PHOSPHATE 4 MG/ML IJ SOLN
INTRAVENOUS | 0 refills | Status: DC
Start: 2018-12-09 — End: 2018-12-09
  Administered 2018-12-09: 12:00:00 10 mg via INTRAVENOUS

## 2018-12-09 MED ORDER — DIAZEPAM 5 MG PO TAB
5 mg | ORAL | 0 refills | Status: DC | PRN
Start: 2018-12-09 — End: 2018-12-10
  Administered 2018-12-10 (×2): 5 mg via ORAL

## 2018-12-09 MED ORDER — MIDAZOLAM 1 MG/ML IJ SOLN
INTRAVENOUS | 0 refills | Status: DC
Start: 2018-12-09 — End: 2018-12-09
  Administered 2018-12-09: 12:00:00 2 mg via INTRAVENOUS

## 2018-12-09 MED ORDER — SENNOSIDES-DOCUSATE SODIUM 8.6-50 MG PO TAB
1 | Freq: Two times a day (BID) | ORAL | 0 refills | Status: DC
Start: 2018-12-09 — End: 2018-12-10
  Administered 2018-12-10 (×2): 1 via ORAL

## 2018-12-09 MED ORDER — CETIRIZINE 10 MG PO TAB
10 mg | Freq: Every morning | ORAL | 0 refills | Status: DC
Start: 2018-12-09 — End: 2018-12-10
  Administered 2018-12-10: 13:00:00 10 mg via ORAL

## 2018-12-09 NOTE — Anesthesia Post-Procedure Evaluation
Post-Anesthesia Evaluation    Name: Patricia Montes      MRN: [id number]     DOB: [birth year]     Age: 53 y.o.     Sex: female   __________________________________________________________________________     Procedure Information     Anesthesia Start Date/Time:  12/09/18 0713    Procedures:       LEFT SKIN SPARING MASTECTOMY (Left Breast) - Dr. Loreta Ave Case Length 1.5 hrs, total case length 2.5hrs  SLNB @ 1000  Frozen Section: yes      IDENTIFICATION SENTINEL LYMPH NODE (Left Breast)      INJECTION RADIOACTIVE TRACER FOR SENTINEL NODE IDENTIFICATION (Left )      LEFT SENTINEL LYMPH NODE BIOPSY (Left Axilla)      RECONSTRUCTION BREAST WITH SIENTRA LPP-FH15S 600-720 FILLED TO 350 CC TISSUE EXPANDER- IMMEDIATE (Left ) - case length=1.5hr      IMPLANTATION ALLODERM EXTRA LARGE PERFORATED CONTOUR BIOLOGIC IMPLANT FOR SOFT TISSUE REINFORCEMENT (Left )      INTRAVENOUS INJECTION AGENT TO TEST VASCULAR FLOW IN FLAP/ GRAFT (Left )    Location:  [facility] OR [facility] / [facility] MAIN OR/PERIOP    Surgeon:  Neldon Mc, DO; Korentager, Richard Mervyn Skeeters, MD          Post-Anesthesia Vitals  BP: 133/87 (09/16 1500)  Temp: 36.7 ?C (98.1 ?F) (09/16 1500)  Pulse: 85 (09/16 1300)  Respirations: 16 PER MINUTE (09/16 1500)  SpO2: 98 % (09/16 1500)  SpO2 Pulse: 68 (09/16 1500)  Height: 162.6 cm (64) (09/16 0618)   Vitals Value Taken Time   BP 145/81 12/09/2018 11:15 AM   Temp 36.7 ?C (98 ?F) 12/09/2018 11:15 AM   Pulse 85 12/09/2018 11:15 AM   Respirations 20 PER MINUTE 12/09/2018 11:15 AM   SpO2 94 % 12/09/2018 11:15 AM         Post Anesthesia Evaluation Note    Evaluation location: pre/post  Patient participation: recovered; patient participated in evaluation  Level of consciousness: alert    Pain score: 5  Pain management: adequate    Hydration: normovolemia  Temperature: 36.0?C - 38.4?C  Airway patency: adequate    Perioperative Events       Post-op nausea and vomiting: no PONV    Postoperative Status  Cardiovascular status: hemodynamically stable Respiratory status: spontaneous ventilation  Follow-up needed: none        Perioperative Events  Perioperative Event: No  Emergency Case Activation: No

## 2018-12-09 NOTE — Anesthesia Pre-Procedure Evaluation
Anesthesia Pre-Procedure Evaluation    Name: Patricia Montes      MRN: [id number]     DOB: [birth year]     Age: 53 y.o.     Sex: female   _________________________________________________________________________     Procedure Info:   Procedure Information     Date/Time:  12/09/18 0715    Procedures:       LEFT SKIN SPARING MASTECTOMY (Left Breast) - Dr. Loreta Ave Case Length 1.5 hrs, total case length 2.5hrs  SLNB @ 1000  Frozen Section: yes      IDENTIFICATION SENTINEL LYMPH NODE (Left )      INJECTION RADIOACTIVE TRACER FOR SENTINEL NODE IDENTIFICATION (Left )      LEFT SENTINEL LYMPH NODE BIOPSY (Left Axilla)      POSSIBLE LEFT AXILLARY LYMPH NODE DISSECTION (Left Axilla)      RECONSTRUCTION BREAST WITH TISSUE EXPANDER AND SUBSEQUENT EXPANSION - IMMEDIATE/ DELAYED (Left ) - case length=1.5hr      IMPLANTATION BIOLOGIC IMPLANT FOR SOFT TISSUE REINFORCEMENT (Left )      INSERTION BREAST PROSTHESIS FOLLOWING MASTOPEXY/ MASTECTOMY/ IN RECONSTRUCTION - IMMEDIATE (Left )      INTRAVENOUS INJECTION AGENT TO TEST VASCULAR FLOW IN FLAP/ GRAFT (Left )    Location:  [facility] / [facility]    Surgeon:  Neldon Mc, DO; Korentager, Pearletha Furl, MD          Physical Assessment  Vital Signs (last filed in past 24 hours):  BP: 139/82 (09/16 0618)  Temp: 37 ?C (98.6 ?F) (09/16 4540)  Pulse: 80 (09/16 0618)  Respirations: 14 PER MINUTE (09/16 0618)  SpO2: 97 % (09/16 0618)  Height: 162.6 cm (64) (09/16 0618)  Weight: 88 kg (194 lb 0.1 oz) (09/16 0618)      Patient History   No Known Allergies     Current Medications    Medication Directions   acetaminophen SR (TYLENOL) 650 mg tablet Take 1,300 mg by mouth at bedtime daily. Tylenol Arthritis   cetirizine (ZYRTEC) 10 mg tablet Take 10 mg by mouth every morning.   cyclobenzaprine (FLEXERIL) 10 mg tablet Take 10 mg by mouth every 8 hours.   diphenhydrAMINE (BENADRYL ALLERGY) 25 mg tablet Take 25 mg by mouth every 6 hours as needed. fluticasone propionate (FLONASE) 50 mcg/actuation nasal spray, suspension Apply 1 spray into nose as directed every 24 hours.   gabapentin 300 mg Tb24 Take 1 tablet by mouth at bedtime daily.   guaiFENesin LA (MUCINEX) 600 mg tablet Take 600 mg by mouth twice daily as needed.   lisinopriL (ZESTRIL) 5 mg tablet TK 1 TABLET PO DAILY   multivit-min-iron-FA-lutein (CENTRUM SILVER WOMEN) 8 mg iron-400 mcg-300 mcg tab Take 1 tablet by mouth daily.   pantoprazole DR (PROTONIX) 20 mg tablet Take 20 mg by mouth at bedtime daily.   traZODone (DESYREL) 50 mg tablet Take 50 mg by mouth at bedtime as needed.         Review of Systems/Medical History      Patient summary reviewed  Pertinent labs reviewed    PONV Screening: Female gender, Non-smoker and Postoperative opioids  No history of anesthetic complications  No family history of anesthetic complications      Airway - negative        Pulmonary - negative      Not a current smoker        No indications/hx of asthma    no COPD      No  sleep apnea      Cardiovascular - negative        Exercise tolerance: >4 METS      Beta Blocker therapy: No      Beta blockers within 24 hours: n/a      No AICD      No hypertension,       No valvular problems/murmurs      No past MI,       No hx of coronary artery disease      No PTCA      No dysrhythmias      No angina      No dyspnea on exertion      GI/Hepatic/Renal - negative        No GERD,       No hx of liver disease     No renal disease      Neuro/Psych - negative      No seizures      No CVA      Musculoskeletal         No neck pain      Arthritis      Endocrine/Other       No diabetes      No hypothyroidism      No anemia      Malignancy (breast cancer)      Obesity (bmi 33)    Constitution - negative   Physical Exam    Airway Findings      Mallampati: II      TM distance: >3 FB      Neck ROM: full      Mouth opening: good      Airway patency: adequate    Dental Findings: Negative      Cardiovascular Findings: Negative Rhythm: regular      Rate: normal      No murmur    Pulmonary Findings: Negative      Breath sounds clear to auscultation.    Abdominal Findings:       Obese    Neurological Findings: Negative      Alert and oriented x 3    Constitutional findings: Negative      No acute distress      Well-developed      Well-nourished       Diagnostic Tests  Hematology:   Lab Results   Component Value Date    HGB 11.8 11/17/2018    HCT 35.5 11/17/2018    PLTCT 365 11/17/2018    WBC 9.7 11/17/2018    NEUT 58 11/17/2018    ANC 5.62 11/17/2018    ALC 2.88 11/17/2018    MONA 11 11/17/2018    AMC 1.10 11/17/2018    EOSA 1 11/17/2018    ABC 0.04 11/17/2018    MCV 97.8 11/17/2018    MCH 32.6 11/17/2018    MCHC 33.3 11/17/2018    MPV 7.4 11/17/2018    RDW 15.3 11/17/2018         General Chemistry:   Lab Results   Component Value Date    NA 139 11/17/2018    K 3.9 11/17/2018    CL 104 11/17/2018    CO2 27 11/17/2018    GAP 8 11/17/2018    BUN 35 11/17/2018    CR 1.08 11/17/2018    GLU 83 11/17/2018    CA 9.5 11/17/2018    ALBUMIN 4.3 11/17/2018    TOTBILI 0.4 11/17/2018      Coagulation: No  results found for: PT, PTT, INR      Anesthesia Plan    ASA score: 2   Plan: general  Induction method: intravenous  NPO status: acceptable      Informed Consent  Anesthetic plan and risks discussed with patient.        Plan discussed with: anesthesiologist, CRNA and surgeon/proceduralist.  Comments: (Risks of GA with LMA/OETT including sore throat, oral/dental injury, allergic reactions, PONV, aspiration, respiratory failure, MI, CVA discussed with patient who reports understanding and consents to anesthetic plan. )

## 2018-12-10 ENCOUNTER — Encounter: Admit: 2018-12-10 | Discharge: 2018-12-10 | Payer: Private Health Insurance - Indemnity | Primary: Family

## 2018-12-10 MED ORDER — OXYCODONE 5 MG PO TAB
5-10 mg | ORAL_TABLET | ORAL | 0 refills | 6.00000 days | Status: DC | PRN
Start: 2018-12-10 — End: 2018-12-31
  Filled 2018-12-10: qty 16, 3d supply, fill #1

## 2018-12-10 MED ORDER — SENNOSIDES-DOCUSATE SODIUM 8.6-50 MG PO TAB
1 | ORAL_TABLET | Freq: Two times a day (BID) | ORAL | 1 refills | Status: DC
Start: 2018-12-10 — End: 2019-11-04

## 2018-12-10 MED ORDER — ACETAMINOPHEN 325 MG PO TAB
650 mg | ORAL_TABLET | ORAL | 0 refills | Status: CN
Start: 2018-12-10 — End: ?

## 2018-12-10 MED ORDER — DIAZEPAM 5 MG PO TAB
5 mg | ORAL_TABLET | ORAL | 0 refills | 7.00000 days | Status: DC | PRN
Start: 2018-12-10 — End: 2019-03-30
  Filled 2018-12-10: qty 32, 8d supply, fill #1

## 2018-12-10 MED ADMIN — SODIUM CHLORIDE 0.9 % IJ SOLN [7319]: 20 mL | INTRAVENOUS | @ 05:00:00 | Stop: 2018-12-10 | NDC 00409488803

## 2018-12-11 ENCOUNTER — Encounter: Admit: 2018-12-11 | Discharge: 2018-12-11 | Payer: Private Health Insurance - Indemnity | Primary: Family

## 2018-12-11 NOTE — Telephone Encounter
Called patient to check in after Left [facility] SLNB on 9/16.  Patient states she is doing well and has no questions or concerns at this time.  Patient confirms post-op appointment for 9/29 at 1:15pm at the [facility]. Patient verbalizes understanding.  All questions answered. Patient given contact information for additional questions or concerns.

## 2018-12-15 ENCOUNTER — Encounter: Admit: 2018-12-15 | Discharge: 2018-12-15 | Payer: Private Health Insurance - Indemnity | Primary: Family

## 2018-12-16 ENCOUNTER — Encounter: Admit: 2018-12-16 | Discharge: 2018-12-16 | Payer: Private Health Insurance - Indemnity | Primary: Family

## 2018-12-16 NOTE — Telephone Encounter
-----   Message from Caprice Kluver sent at 12/15/2018 11:18 PM CDT -----  Regarding: Visit Follow-Up Question  Contact: [phone]  Is it normal to have some pains where the incision is? I didn't have them the first few days but today I've felt them several times. Also the boob feels very soft & there is a fold of skin near the bottom of the breast, as shown in picture.  I'm still having pain, but it mostly where the hoses are attached.  Just checking to make sure this is all normal. Thank you!     Patricia Montes

## 2018-12-16 NOTE — Telephone Encounter
Pt called back to discuss myChart message. Reassured symptoms of intermittent pain is most likely nerve related. With fills fold may improve. Denies fever/chills/redness. No strike through noted on therabond. To follow up as discussed, sooner if any concerns. Tilda Burrow, RN

## 2018-12-17 ENCOUNTER — Encounter: Admit: 2018-12-17 | Discharge: 2018-12-17 | Payer: Private Health Insurance - Indemnity | Primary: Family

## 2018-12-22 ENCOUNTER — Encounter

## 2018-12-22 DIAGNOSIS — G4700 Insomnia, unspecified: Secondary | ICD-10-CM

## 2018-12-22 DIAGNOSIS — J302 Other seasonal allergic rhinitis: Secondary | ICD-10-CM

## 2018-12-22 DIAGNOSIS — M1990 Unspecified osteoarthritis, unspecified site: Secondary | ICD-10-CM

## 2018-12-22 DIAGNOSIS — C50919 Malignant neoplasm of unspecified site of unspecified female breast: Secondary | ICD-10-CM

## 2018-12-22 DIAGNOSIS — C50112 Malignant neoplasm of central portion of left female breast: Secondary | ICD-10-CM

## 2018-12-22 NOTE — Progress Notes
HPI:  13 days s/p left skin sparing mastectomy/SLNB/TE for left grade 3 IDC. [name] reports she is doing well. Dr. Loney Laurence is planning to remove her drains today.    PHYSICAL EXAM:    Breast:  left    Incision:  c/d/i    Erythema:  No    Ecchymosis:  No    Seroma:  No  Axilla:    Incision:  c/d/i    Erythema:  No    Ecchymosis:  No    Seroma:  No    Upper Extremity:    ROM:  Good    Winged scapula: No    Lymphedema:  No    PATHOLOGY:    Final Diagnosis:     A. Lymph node, one, left axillary sentinel lymph node #1, excision:   Scattered residual tumor cells in a background of treatment effect   encompassing 1 mm. (1/1)   Deeper sections and a pancytokeratin immunostain are positive in   support of the above diagnosis. ?   ?   B. Breast, left skin sparing mastectomy, short stitch superior, long   stitch lateral, mastectomy, status post neoadjuvant therapy:   Scant foci of scattered residual invasive ductal carcinoma in a   background of treatment effect, with the largest contiguous ? ? ?focus of   tumor measuring 0.73mm. ?See comment   Ductal carcinoma in situ, nuclear grade 3   Biopsy site change ?     Comment:   INVASIVE CARCINOMA OF THE BREAST STATUS POST NEOADJUVANT THERAPY   CAP Version: Invasive Breast 4.3.0.1     Procedure: Mastectomy   Laterality: Left ?   Tumor Site: Upper inner quadrant/central: 12:00   Tumor Bed Identified: Yes   Size/Extent of Tumor Bed: 2.5 x 1.4 x 1.1 cm   Size/Extent of Residual Invasive Tumor: 0.7 mm   Overall Residual Cellularity of the Tumor Bed: Less than 5%   Histologic Type: Invasive ductal carcinoma   Histologic Grade (Nottingham Histologic Score): Tumor foci are too small   for accurate grade   Tubule Formation: 3   Nuclear Grade: 3   Mitotic Count (40x objective): Too few cells for accurate mitotic   count   Ductal Carcinoma In-situ (DCIS): ?   Present. ?Less than 5% in the residual cellularity DCIS within and/or adjacent to invasive carcinoma or tumor bed: Yes   DCIS separate from invasive carcinoma: No ?   Surgical Margins: ?   Mastectomy   For invasive carcinoma: Greater than 2 mm from all margins   For DCIS: Greater than 2 mm from all margins   Lymph-Vascular Invasion: Present ?   Nipple Involvement: Absent   Skin Involvement: Absent   Skeletal Muscle:   Not present   Lymph Node Sampling:   Sentinel lymph node(s) only   Total number of lymph nodes examined:   Number of lymph nodes with macrometastases (>2 mm):0   Number of lymph nodes with micrometastases (>0.2 mm to 2 mm):1   Number of lymph nodes with isolated tumor cells (<0.2 mm and   <200 cells): 0   Size of largest metastasis deposit (millimeters): 1mm   Extranodal extension: Absent   Evidence of treatment response (fibrous scarring) in the metastases:   present   Number of lymph nodes with evidence of treatment response (fibrous   scarring) but no tumor cells: 0   Treatment Effect in the Breast   Probable or definite response to presurgical therapy in the invasive   carcinoma  Prognostic markers: The results will be issued as an addendum.   Time between tumor removal and placement into formalin < 1 hour: ?Yes   Fixation Time between 6-72 hours: ?Yes     Pathologic Staging (pTNM, AJCC 8th Edition): ?   ypT97mi N1(mi)     TNM Descriptors (required only if applicable) (select all that apply)   ___ y (posttreatment)     Primary Tumor (Invasive Carcinoma) (pT)   pT28mi: ? ? ?Tumor d1 mm in greatest dimension (microinvasion)   Regional Lymph Nodes (pN)   (sn): ? ? Only sentinel node(s) evaluated. ?If 6 or more sentinel nodes   and/or nonsentinel nodes are removed, this modifier should not be used.   pN23mi: ? ? Micrometastases (approximately 200 cells, larger than 0.2 mm,   but none larger than 2.0 mm)     The pathologic stage assigned here should be regarded as provisional, as   it reflects only current pathologic data and does not incorporate full knowledge of the patient's clinical status and/or prior pathology.     Block for Biomarker Testing: B15       ASSESSMENT/PLAN:  S/P:  13 days s/p left skin sparing mastectomy/SLNB/TE for left grade 3 IDC    The pathology was reviewed with [name] and she was given a copy of the results. She only had 1 SLNB removed and there were cancer cells present in the lymph node. She was presented at Tumor Conference last week and either standard of care with ALND and PMRT was recommended or she can participate in RT Charm. We need to confirm that she is eligible for the RT Charm trial with only 1 lymph node being removed without an ALND. If she can participate, she would prefer to participate in RT Charm and forgo ALND. We will contact her and confirm that she can participate. [name] met with Dr. Loney Laurence today for drain and dressing removal. She has a follow up with Dr. Arliss Journey on 12/25/18 for medical oncology. We will place a referral to radiation oncology for discussion. [name] is to follow up in the APP clinic in 8 months. We will refer her for lymphedema education. She will have a BIS in 8 months for lymphedema surveillance. She will have a right diagnostic mammogram in 8 months with her clinical exam. She was given ample time to ask questions all of which were answered to her satisfaction. She was encouraged to call with any interval questions or concerns. This patient was evaluated and treated by Dr. Loreta Ave.    1. RT Charm - forgo ALND if possible  2. Refer to OP office for radiation  3. Continue follow up with Dr. Loney Laurence  4. Continue follow up with Dr. Arliss Journey  5. Lymphedema education  6. BIS in 8 months  7. Follow up in APP clinic in 8 months with right diagnostic mammogram    Guy Begin, PA-C

## 2018-12-23 ENCOUNTER — Encounter: Admit: 2018-12-23 | Discharge: 2018-12-23 | Payer: Private Health Insurance - Indemnity | Primary: Family

## 2018-12-23 NOTE — Telephone Encounter
Called patient after receiving a message from the schedulers that patient had multiple questions regarding radiation appointment and clinical trial. Patient asks why she needs to have a lymphedema prevention appointment since she met with them in July. Educated patient that we made that appointment in case she has to have ALND (if she does not qualify for clinical trial) so she could have a refresher and understand prevention of lymphedema s/p this procedure specifically. Patient asks why she needs to make an appointment with a rad/onc in [place] because if she does not qualify for the clinical trial, she will want to receive radiation in [place]. Educated patient that we wanted to get this appointment on the books because it can take a little bit to get in with a rad/onc and if she does qualify for the trial, she will need to get radiation here. If she does not qualify, we can cancel this appointment and she can establish care with a rad/onc at home after she has her ALND. Patient verbalizes understanding.  All questions answered. Patient given contact information for additional questions or concerns.

## 2018-12-24 ENCOUNTER — Encounter: Admit: 2018-12-24 | Discharge: 2018-12-24 | Payer: Private Health Insurance - Indemnity | Primary: Family

## 2018-12-25 ENCOUNTER — Encounter: Admit: 2018-12-25 | Discharge: 2018-12-25 | Payer: Private Health Insurance - Indemnity | Primary: Family

## 2018-12-25 DIAGNOSIS — C50919 Malignant neoplasm of unspecified site of unspecified female breast: Secondary | ICD-10-CM

## 2018-12-25 DIAGNOSIS — M1990 Unspecified osteoarthritis, unspecified site: Secondary | ICD-10-CM

## 2018-12-25 DIAGNOSIS — G4700 Insomnia, unspecified: Secondary | ICD-10-CM

## 2018-12-25 DIAGNOSIS — G620 Drug-induced polyneuropathy: Secondary | ICD-10-CM

## 2018-12-25 DIAGNOSIS — C50112 Malignant neoplasm of central portion of left female breast: Secondary | ICD-10-CM

## 2018-12-25 DIAGNOSIS — J302 Other seasonal allergic rhinitis: Secondary | ICD-10-CM

## 2018-12-25 NOTE — Progress Notes
Name: Patricia Montes          MRN: [id number]      DOB: 05-30-1965      AGE: 53 y.o.   DATE OF SERVICE: 12/25/2018    Subjective:             Reason for Visit:  No chief complaint on file.      Patricia Montes is a 53 y.o. female.     Is here for follow-up visit of breast cancer.    She received 5 cycles of neoadjuvant chemotherapy with Taxotere carboplatin and Herceptin under Dr. Molli Hazard .second cycle #6 of chemotherapy was discontinued because of toxicity she developed  peripheral neuropathy .  She proceeded with surgery.  She had left mastectomy sentinel lymph node biopsy.  She had micrometastatic disease in 1 sentinel lymph node and small foci of residual carcinoma in the breast.  Overall she had very good response to neoadjuvant chemotherapy.    She is here now for recommendations regarding adjuvant chemotherapy   She is doing well.  She does not have complaints of shortness of breath, denies PND orthopnea.  She has numbness in her fingertips.  She is taking gabapentin.  She does not think this is helping at all but overall she is the symptom is not too bothersome  She had last echo in April /2020      Cancer Staging  Malignant neoplasm of central portion of left breast in female, estrogen receptor positive (HCC)  Staging form: Breast, AJCC 8th Edition  - Clinical stage from 06/15/2018: Stage IA (cT1c, cN0, cM0, G3, ER+, PR+, HER2+) - Signed by Andrew Au, PA-C on 07/13/2018  - Pathologic: No Stage Recommended (ypT2, pN1, cM0, G3, ER+, PR+, HER2+) - Signed by Neldon Mc, DO on 12/17/2018      History of Present Illness         Review of Systems   Constitutional: Negative for fatigue.   HENT: Negative.    Eyes: Negative.    Respiratory: Negative.    Cardiovascular: Negative.    Gastrointestinal: Negative.    Endocrine: Negative.    Genitourinary: Negative.    Musculoskeletal: Negative.    Neurological: Positive for numbness.   Psychiatric/Behavioral: The patient is nervous/anxious. Objective:         ? acetaminophen (TYLENOL) 325 mg tablet Take two tablets by mouth every 6 hours. Take scheduled for 3 days after surgery, then as needed. Do not exceed 4,000mg  in a 24 hour period.   ? cetirizine (ZYRTEC) 10 mg tablet Take 10 mg by mouth every morning.   ? cyclobenzaprine (FLEXERIL) 10 mg tablet Take 10 mg by mouth every 8 hours.   ? diazePAM (VALIUM) 5 mg tablet Take one tablet by mouth every 6 hours as needed (muscle spasm).   ? diphenhydrAMINE (BENADRYL ALLERGY) 25 mg tablet Take 25 mg by mouth every 6 hours as needed.   ? fluticasone propionate (FLONASE) 50 mcg/actuation nasal spray, suspension Apply 1 spray into nose as directed every 24 hours.   ? gabapentin 300 mg Tb24 Take 1 tablet by mouth at bedtime daily.   ? guaiFENesin LA (MUCINEX) 600 mg tablet Take 600 mg by mouth twice daily as needed.   ? levoFLOXacin (LEVAQUIN) 500 mg tablet Take 500 mg by mouth daily.   ? lisinopriL (ZESTRIL) 5 mg tablet TK 1 TABLET PO DAILY   ? multivit-min-iron-FA-lutein (CENTRUM SILVER WOMEN) 8 mg iron-400 mcg-300 mcg tab Take 1 tablet by mouth daily.   ?  oxyCODONE (ROXICODONE) 5 mg tablet Take one tablet to two tablets by mouth every 6 hours as needed for Pain (rated 5/10 or higher)  Do not exceed 6 tabs in a 24 hour period.   ? pantoprazole DR (PROTONIX) 20 mg tablet Take 20 mg by mouth at bedtime daily.   ? senna/docusate (SENOKOT-S) 8.6/50 mg tablet Take one tablet by mouth twice daily. To prevent constipation while taking pain medication.   ? traZODone (DESYREL) 50 mg tablet Take 50 mg by mouth at bedtime as needed.     There were no vitals filed for this visit.  There is no height or weight on file to calculate BMI.                  Pain Addressed:  N/A    Patient Evaluated for a Clinical Trial: Patient not eligible for a treatment trial (including not needing treatment, needs palliative care, in remission).     [place] Cooperative Oncology Group performance status is 0, Fully active, able to carry on all pre-disease performance without restriction.Marland Kitchen     Physical Exam  Constitutional:       Appearance: Normal appearance. She is well-developed.   Eyes:      General: No scleral icterus.  Neck:      Musculoskeletal: Neck supple.   Cardiovascular:      Rate and Rhythm: Normal rate and regular rhythm.   Pulmonary:      Breath sounds: Normal breath sounds.   Abdominal:      General: Bowel sounds are normal.      Palpations: Abdomen is soft.   Lymphadenopathy:      Upper Body:      Right upper body: No supraclavicular adenopathy.      Left upper body: No supraclavicular adenopathy.   Neurological:      Mental Status: She is alert.   Psychiatric:         Mood and Affect: Mood normal.              Estrogen Receptor (ER): ? ? ? ? ?95% ? ? Positive ? ? Stain Intensity:   Strong   Progesterone Receptor (PR): ? ? 66% ? ? Positive ? ? Stain Intensity:   Strong   Her2: ? ? ? ? ? ? ? ? ? ?Too few cells for accurate score     A. Lymph node, one, left axillary sentinel lymph node #1, excision:   Scattered residual tumor cells in a background of treatment effect   encompassing 1 mm. (1/1)   Deeper sections and a pancytokeratin immunostain are positive in   support of the above diagnosis. ?   ?   B. Breast, left skin sparing mastectomy, short stitch superior, long   stitch lateral, mastectomy, status post neoadjuvant therapy:   Scant foci of scattered residual invasive ductal carcinoma in a   background of treatment effect, with the largest contiguous ? ? ?focus of   tumor measuring 0.22mm. ?See comment   Ductal carcinoma in situ, nuclear grade 3   Biopsy site change ?     Comment:   INVASIVE CARCINOMA OF THE BREAST STATUS POST NEOADJUVANT THERAPY   CAP Version: Invasive Breast 4.3.0.1     Procedure: Mastectomy   Laterality: Left ?   Tumor Site: Upper inner quadrant/central: 12:00   Tumor Bed Identified: Yes   Size/Extent of Tumor Bed: 2.5 x 1.4 x 1.1 cm   Size/Extent of Residual Invasive Tumor: 0.7 mm Overall Residual  Cellularity of the Tumor Bed: Less than 5%   Histologic Type: Invasive ductal carcinoma   Histologic Grade (Nottingham Histologic Score): Tumor foci are too small   for accurate grade   Tubule Formation: 3   Nuclear Grade: 3   Mitotic Count (40x objective): Too few cells for accurate mitotic   count   Ductal Carcinoma In-situ (DCIS): ?   Present. ?Less than 5% in the residual cellularity   DCIS within and/or adjacent to invasive carcinoma or tumor bed: Yes   DCIS separate from invasive carcinoma: No ?   Surgical Margins: ?   Mastectomy   For invasive carcinoma: Greater than 2 mm from all margins   For DCIS: Greater than 2 mm from all margins   Lymph-Vascular Invasion: Present ?   Nipple Involvement: Absent   Skin Involvement: Absent   Skeletal Muscle:   Not present   Lymph Node Sampling:   Sentinel lymph node(s) only   Total number of lymph nodes examined:   Number of lymph nodes with macrometastases (>2 mm):0   Number of lymph nodes with micrometastases (>0.2 mm to 2 mm):1   Number of lymph nodes with isolated tumor cells (<0.2 mm and   <200 cells): 0   Size of largest metastasis deposit (millimeters): 1mm   Extranodal extension: Absent   Evidence of treatment response (fibrous scarring) in the metastases:   present   Number of lymph nodes with evidence of treatment response (fibrous   scarring) but no tumor cells: 0   Treatment Effect in the Breast   Probable or definite response to presurgical therapy in the invasive   carcinoma   Prognostic markers: The results will be issued as an addendum.   Time between tumor removal and placement into formalin < 1 hour: ?Yes   Fixation Time between 6-72 hours: ?Yes     Pathologic Staging (pTNM, AJCC 8th Edition): ?   ypT45mi N1(mi)     Assessment and Plan:       Encounter Diagnoses   Name Primary?   ? Cancer of central portion of left breast (HCC) Yes   ? 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  She had very good response to neoadjuvant chemotherapy.  Unfortunately she was not able to complete 6 cycles of neoadjuvant chemotherapy.  She had small residual tumor around the the breast and 1 micro met.  Compounders recommend to consider Kadcyla x14 cycles.  If she cannot tolerate Kadcyla , Herceptin single  agent was recommended.  Toxicities of Kadcyla discussed  Kendal Hymen is interested in trying Kadcyla  I discussed this case with Dr. Molli Hazard.  He will consider giving Kadcyla either during radiation or after radiation    Multidisciplinary tumor conference recommended adjuvant radiation versus lymph node dissection.  Patient follows Dr. Loreta Ave closely. she is being screened for clinical trial    She understands after completion of radiation she will start adjuvant endocrine therapy.  Options of adjuvant endocrine therapy will be discussed by Dr. Katherina Mires -Dr. Molli Hazard is monitoring LVEF .  She is scheduled for echo    Kendal Hymen will continue treatment with Dr. Molli Hazard  I informed her no follow-up is necessary with me.  If she has any questions will be happy to arrange telehealth visit

## 2018-12-28 NOTE — Telephone Encounter
Called patient to let her know that per Dr. Earleen Newport, she is eligible for the RT CHARM clinically trial. Educated patient that she should keep her appointment with Dr. Laurence Compton on 10/8 for radiation consultation. Patient is very happy with this news. Patient verbalizes understanding.  All questions answered. Patient given contact information for additional questions or concerns.

## 2018-12-29 ENCOUNTER — Encounter: Admit: 2018-12-29 | Discharge: 2018-12-29 | Payer: Private Health Insurance - Indemnity | Primary: Family

## 2019-01-01 ENCOUNTER — Encounter: Admit: 2019-01-01 | Discharge: 2019-01-01 | Payer: Private Health Insurance - Indemnity | Primary: Family

## 2019-01-04 ENCOUNTER — Encounter: Admit: 2019-01-04 | Discharge: 2019-01-04 | Payer: Private Health Insurance - Indemnity | Primary: Family

## 2019-01-05 ENCOUNTER — Encounter: Admit: 2019-01-05 | Discharge: 2019-01-05 | Payer: Private Health Insurance - Indemnity | Primary: Family

## 2019-01-05 NOTE — Progress Notes
Social Work Note      Name:  [facility] #:  [id number]    PLAN:  Provide pt with local lodging resource.      INTERVENTIONS:  SW notified by [place] [facility] pt will start radiation treatment soon and needs local lodging resources.    SW contacted pt and introduced self. SW explained [facility] is currently closed due to the pandemic but informed pt of [facility] [place]. SW provided pt with their contact information and pt plans to call them. Pt denies any other needs at this time and SW provided pt with SW contact information.    SW updated CNC.    Salvadore Farber, [place]

## 2019-01-05 NOTE — Progress Notes
Obtained patient's verbal consent to treat them and their agreement to [facility] financial policy and NPP via this telehealth visit during the [facility] Emergency    LYMPHEDEMA DATASHEET-BASELINE    DATE:  01/05/2019    PATIENT NAME:   Patricia Montes  DATE OF BIRTH:   [birth year]  MRN:   [id number]    Enrollment Visit Type:  Post-Operative    BASELINE DIAGNOSTIC ASSESSMENT    Surgery performed at [facility]?   Yes  Surgical Procedure:  Other LN Dissection, Skin-sparing Mastecomy and SLN Mapping:     Plan:  Neo Adjuvant and Radiation, (considering)    We discussed that with her Sentinel Lymph node surgery, this puts her in a 'Low Risk' category for developing Lymphedema.    BASELINE MEASUREMENTS  BIS = 3.5 LEFT (True baseline 06/30/18)   BIS = 4.1 after Neo adjuvant chemotherapy8/25/20    Handedness:  right handed    Medical Team:   Surgeon: Loreta Ave   Plastic surgeon: Loney Laurence  Medical oncologist: Arliss Journey  Radiation oncologist: Illene Bolus    The patient presents today for post-operative lymphedema education/evaluation. ( Received Pre-operative education on 10/07/18)     Risk factors for the development of breast cancer treatment related lymphedema include:   1.  Left mastectomy   2.  Left SLNBX  3. Radiation Therapy  4. Chemotherapy   5. BMI > 30   6. Other medical comorbidity:     Lymphedema evaluation, teaching and care plan:     1. General review of Lymphedema pathophysiology in breast cancer patients. No barriers to learning noted, patient willing and active participant.  Very interested in learning about topic, prevention.  Thoughtful questions demonstrated good insight into etiology and prevention.      2. Baseline assessment and measurements completed including:  L-Dex Bioimpedence test (SOZO), discussed used to monitor lymphatic flow, and will be used for comparison approx 6-8 months after surgery and beyond    3. Signs and Symptoms to watch for once initial symptoms from surgery have resolved reviewed including but not limited to:  achiness, tingling, discomfort, or increased warmth in the hand, arm, chest, breast, or underarm areas. Feelings of fullness or heaviness in the hand, arm, chest, breast, or underarm. Tightness or decreased flexibility in nearby joints, such as the shoulder, hand, or wrist. Trouble fitting the arm into a jacket or shirt sleeve that fit well before. Difficulty getting watches, rings, or bracelets on and off are later signs    4. Risk Factors:  BMI, Obesity and weight gain.  Optimal weight management is ideal for overall health. Discussed current BMI.Reminded that good nutrition is needed for healing, and can focus on weight loss at a later date.  Exercise when approved by surgeon and Plastic surgeon may be resumed.  Exercise is beneficial as well as the benefit of upper body strengthening. Pt mentions a tender area under left arm, sharp discomfort like a stitch, states her bra rubs it.Denies redness, swelling. Encouraged to try a 'Shapewear tanktop to provide more even support. States she did talk with Dr. Loreta Ave about this at their appointment yesterday.     We discussed that with her Sentinel Lymph node surgery, she does NOT need to wear compression garment for exercise or air travel.    Precautions regarding meticulous skin care reviewed.  This includes but not limited to sunscreen, burns, gardening, manicures, cuts, scrapes, signs of infection.  Avoidance of affect arm  needlestick's, blood pressures, finger sticks and other applicable prevention  discussion completed.     5. When to call the office: Unrelenting aching, signs of infection, or Sudden swelling or swelling that appears very quickly. Hand, arm, or other body parts seem to suddenly ?blow up? may necessitate an evaluation.     6. RTC : Routine follow up with BIS surveillance. BIS to be coordinated with routine surgical follow ups (6-8 months after surgery).  Written handouts regarding covered material provided via e-mail prior to today's visit.    Total time 25 minutes. Estimated counseling time 23 minutes regarding signs symptoms, risk factors, monitoring, when to contact us and follow up plan.  Patient agreeable to follow up plan.     Provided patient with contact information for follow up should questions or concerns arise.

## 2019-01-06 ENCOUNTER — Encounter: Admit: 2019-01-06 | Discharge: 2019-01-06 | Payer: Private Health Insurance - Indemnity | Primary: Family

## 2019-01-06 DIAGNOSIS — C50112 Malignant neoplasm of central portion of left female breast: Secondary | ICD-10-CM

## 2019-01-07 ENCOUNTER — Encounter: Admit: 2019-01-07 | Discharge: 2019-01-07 | Payer: Private Health Insurance - Indemnity | Primary: Family

## 2019-01-07 DIAGNOSIS — M1990 Unspecified osteoarthritis, unspecified site: Secondary | ICD-10-CM

## 2019-01-07 DIAGNOSIS — G4700 Insomnia, unspecified: Secondary | ICD-10-CM

## 2019-01-07 DIAGNOSIS — C50919 Malignant neoplasm of unspecified site of unspecified female breast: Secondary | ICD-10-CM

## 2019-01-07 DIAGNOSIS — J302 Other seasonal allergic rhinitis: Secondary | ICD-10-CM

## 2019-01-12 ENCOUNTER — Encounter: Admit: 2019-01-12 | Discharge: 2019-01-12 | Payer: Private Health Insurance - Indemnity | Primary: Family

## 2019-01-12 ENCOUNTER — Ambulatory Visit: Admit: 2019-01-12 | Discharge: 2019-01-13 | Payer: Private Health Insurance - Indemnity | Primary: Family

## 2019-01-12 DIAGNOSIS — Z4889 Encounter for other specified surgical aftercare: Secondary | ICD-10-CM

## 2019-01-12 DIAGNOSIS — Z1159 Encounter for screening for other viral diseases: Secondary | ICD-10-CM

## 2019-01-12 NOTE — Progress Notes
Pt here for deflation prior to radiation, Left chest cleansed and prepped with alcohol and chlorprep, with use of sterile technique, left TE deflated to 27ml, 472ml NS aspirated, Pt tolerated well. Follow up as discussed, sooner if any concerns. Tilda Burrow, RN

## 2019-01-15 ENCOUNTER — Encounter: Admit: 2019-01-15 | Discharge: 2019-01-15 | Payer: Private Health Insurance - Indemnity | Primary: Family

## 2019-01-18 ENCOUNTER — Encounter: Admit: 2019-01-18 | Discharge: 2019-01-19 | Payer: Private Health Insurance - Indemnity | Primary: Family

## 2019-01-18 DIAGNOSIS — Z1159 Encounter for screening for other viral diseases: Secondary | ICD-10-CM

## 2019-01-19 ENCOUNTER — Encounter: Admit: 2019-01-19 | Discharge: 2019-01-19 | Payer: Private Health Insurance - Indemnity | Primary: Family

## 2019-01-20 ENCOUNTER — Encounter: Admit: 2019-01-20 | Discharge: 2019-01-20 | Payer: Private Health Insurance - Indemnity | Primary: Family

## 2019-01-22 ENCOUNTER — Encounter: Admit: 2019-01-22 | Discharge: 2019-01-22 | Payer: Private Health Insurance - Indemnity | Primary: Family

## 2019-01-25 ENCOUNTER — Encounter: Admit: 2019-01-25 | Discharge: 2019-01-25 | Payer: Private Health Insurance - Indemnity | Primary: Family

## 2019-01-25 ENCOUNTER — Ambulatory Visit: Admit: 2019-01-25 | Discharge: 2019-01-25 | Payer: Private Health Insurance - Indemnity | Primary: Family

## 2019-01-26 ENCOUNTER — Encounter: Admit: 2019-01-26 | Discharge: 2019-01-26 | Payer: Private Health Insurance - Indemnity | Primary: Family

## 2019-01-28 ENCOUNTER — Encounter: Admit: 2019-01-28 | Discharge: 2019-01-28 | Payer: Private Health Insurance - Indemnity | Primary: Family

## 2019-01-29 ENCOUNTER — Encounter: Admit: 2019-01-29 | Discharge: 2019-01-29 | Payer: Private Health Insurance - Indemnity | Primary: Family

## 2019-01-29 DIAGNOSIS — C50112 Malignant neoplasm of central portion of left female breast: Secondary | ICD-10-CM

## 2019-02-01 ENCOUNTER — Ambulatory Visit: Admit: 2019-02-01 | Discharge: 2019-02-01 | Payer: Private Health Insurance - Indemnity | Primary: Family

## 2019-02-01 ENCOUNTER — Encounter: Admit: 2019-02-01 | Discharge: 2019-02-01 | Payer: Private Health Insurance - Indemnity | Primary: Family

## 2019-02-01 DIAGNOSIS — M1990 Unspecified osteoarthritis, unspecified site: Secondary | ICD-10-CM

## 2019-02-01 DIAGNOSIS — C50919 Malignant neoplasm of unspecified site of unspecified female breast: Secondary | ICD-10-CM

## 2019-02-01 DIAGNOSIS — J302 Other seasonal allergic rhinitis: Secondary | ICD-10-CM

## 2019-02-01 DIAGNOSIS — G4700 Insomnia, unspecified: Secondary | ICD-10-CM

## 2019-02-01 MED ORDER — HYDROCORTISONE 2.5 % TP CREA
Freq: Four times a day (QID) | TOPICAL | 3 refills | 50.00000 days | Status: DC | PRN
Start: 2019-02-01 — End: 2019-03-30
  Filled 2019-02-02: qty 28, 14d supply, fill #1

## 2019-02-02 ENCOUNTER — Encounter: Admit: 2019-02-02 | Discharge: 2019-02-02 | Payer: Private Health Insurance - Indemnity | Primary: Family

## 2019-02-03 ENCOUNTER — Encounter: Admit: 2019-02-03 | Discharge: 2019-02-03 | Payer: Private Health Insurance - Indemnity | Primary: Family

## 2019-02-03 DIAGNOSIS — M79671 Pain in right foot: Secondary | ICD-10-CM

## 2019-02-04 ENCOUNTER — Encounter: Admit: 2019-02-04 | Discharge: 2019-02-04 | Payer: Private Health Insurance - Indemnity | Primary: Family

## 2019-02-05 ENCOUNTER — Encounter: Admit: 2019-02-05 | Discharge: 2019-02-05 | Payer: Private Health Insurance - Indemnity | Primary: Family

## 2019-02-08 ENCOUNTER — Encounter: Admit: 2019-02-08 | Discharge: 2019-02-08 | Payer: Private Health Insurance - Indemnity | Primary: Family

## 2019-02-08 ENCOUNTER — Ambulatory Visit: Admit: 2019-02-08 | Discharge: 2019-02-08 | Payer: Private Health Insurance - Indemnity | Primary: Family

## 2019-02-08 DIAGNOSIS — M84374D Stress fracture, right foot, subsequent encounter for fracture with routine healing: Secondary | ICD-10-CM

## 2019-02-08 DIAGNOSIS — C50919 Malignant neoplasm of unspecified site of unspecified female breast: Secondary | ICD-10-CM

## 2019-02-08 DIAGNOSIS — M79671 Pain in right foot: Secondary | ICD-10-CM

## 2019-02-08 DIAGNOSIS — M1990 Unspecified osteoarthritis, unspecified site: Secondary | ICD-10-CM

## 2019-02-08 DIAGNOSIS — J302 Other seasonal allergic rhinitis: Secondary | ICD-10-CM

## 2019-02-08 DIAGNOSIS — G4700 Insomnia, unspecified: Secondary | ICD-10-CM

## 2019-02-08 MED ORDER — RXAMB DIPH/LIDO/ANTACID 1:1:1 (COMPOUND)
10 mL | Freq: Three times a day (TID) | ORAL | 3 refills | 28.00000 days | Status: AC | PRN
Start: 2019-02-08 — End: ?

## 2019-02-09 ENCOUNTER — Encounter: Admit: 2019-02-09 | Discharge: 2019-02-09 | Payer: Private Health Insurance - Indemnity | Primary: Family

## 2019-02-10 ENCOUNTER — Encounter: Admit: 2019-02-10 | Discharge: 2019-02-10 | Payer: Private Health Insurance - Indemnity | Primary: Family

## 2019-02-10 DIAGNOSIS — E559 Vitamin D deficiency, unspecified: Secondary | ICD-10-CM

## 2019-02-10 MED ORDER — ERGOCALCIFEROL (VITAMIN D2) 1,250 MCG (50,000 UNIT) PO CAP
1 | ORAL_CAPSULE | ORAL | 0 refills | 56.00000 days | Status: DC
Start: 2019-02-10 — End: 2019-03-30

## 2019-02-11 ENCOUNTER — Encounter: Admit: 2019-02-11 | Discharge: 2019-02-11 | Payer: Private Health Insurance - Indemnity | Primary: Family

## 2019-02-12 ENCOUNTER — Encounter: Admit: 2019-02-12 | Discharge: 2019-02-12 | Payer: Private Health Insurance - Indemnity | Primary: Family

## 2019-02-15 ENCOUNTER — Ambulatory Visit: Admit: 2019-02-15 | Discharge: 2019-02-15 | Payer: Private Health Insurance - Indemnity | Primary: Family

## 2019-02-15 ENCOUNTER — Encounter: Admit: 2019-02-15 | Discharge: 2019-02-15 | Payer: Private Health Insurance - Indemnity | Primary: Family

## 2019-02-15 DIAGNOSIS — J302 Other seasonal allergic rhinitis: Secondary | ICD-10-CM

## 2019-02-15 DIAGNOSIS — G4700 Insomnia, unspecified: Secondary | ICD-10-CM

## 2019-02-15 DIAGNOSIS — M1990 Unspecified osteoarthritis, unspecified site: Secondary | ICD-10-CM

## 2019-02-15 DIAGNOSIS — C50919 Malignant neoplasm of unspecified site of unspecified female breast: Secondary | ICD-10-CM

## 2019-02-15 MED ORDER — SILVER SULFADIAZINE 1 % TP CREA
Freq: Three times a day (TID) | TOPICAL | 3 refills | 10.00000 days | Status: DC | PRN
Start: 2019-02-15 — End: 2019-11-04
  Filled 2019-02-16: qty 400, 30d supply, fill #1

## 2019-02-16 ENCOUNTER — Encounter: Admit: 2019-02-16 | Discharge: 2019-02-16 | Payer: Private Health Insurance - Indemnity | Primary: Family

## 2019-02-16 MED FILL — RXAMB DIPH/LIDO/ANTACID 1:1:1 (COMPOUND): ORAL | 10 days supply | Qty: 300 | Fill #1 | Status: AC

## 2019-02-17 ENCOUNTER — Encounter: Admit: 2019-02-17 | Discharge: 2019-02-17 | Payer: Private Health Insurance - Indemnity | Primary: Family

## 2019-02-23 ENCOUNTER — Encounter: Admit: 2019-02-23 | Discharge: 2019-02-23 | Payer: Private Health Insurance - Indemnity | Primary: Family

## 2019-02-23 ENCOUNTER — Ambulatory Visit: Admit: 2019-02-23 | Discharge: 2019-02-23 | Payer: Private Health Insurance - Indemnity | Primary: Family

## 2019-02-24 ENCOUNTER — Encounter: Admit: 2019-02-24 | Discharge: 2019-02-24 | Payer: Private Health Insurance - Indemnity | Primary: Family

## 2019-02-24 NOTE — Telephone Encounter
02/24/19. At [facility] request Dr. Laurence Compton reviewed Patricia Montes's radiation treatment plan with her and addressed all questions and concerns. Patricia Montes was very happy after this discussion. Patricia Montes was encouraged to reach out if she had anymore questions or concerns. Patricia Montes was thankful. EAlvey.

## 2019-02-25 ENCOUNTER — Encounter: Admit: 2019-02-25 | Discharge: 2019-02-25 | Payer: Private Health Insurance - Indemnity | Primary: Family

## 2019-03-04 ENCOUNTER — Encounter: Admit: 2019-03-04 | Discharge: 2019-03-04 | Payer: Private Health Insurance - Indemnity | Primary: Family

## 2019-03-09 ENCOUNTER — Encounter: Admit: 2019-03-09 | Discharge: 2019-03-09 | Payer: Private Health Insurance - Indemnity | Primary: Family

## 2019-03-09 MED ORDER — OXYCODONE 5 MG PO TAB
5 mg | ORAL_TABLET | ORAL | 0 refills | 6.00000 days | Status: DC | PRN
Start: 2019-03-09 — End: 2019-03-30

## 2019-03-10 ENCOUNTER — Ambulatory Visit: Admit: 2019-03-10 | Discharge: 2019-03-10 | Payer: Private Health Insurance - Indemnity | Primary: Family

## 2019-03-12 ENCOUNTER — Encounter: Admit: 2019-03-12 | Discharge: 2019-03-12 | Payer: Private Health Insurance - Indemnity | Primary: Family

## 2019-03-15 ENCOUNTER — Encounter: Admit: 2019-03-15 | Discharge: 2019-03-15 | Payer: Private Health Insurance - Indemnity | Primary: Family

## 2019-03-15 ENCOUNTER — Ambulatory Visit: Admit: 2019-03-15 | Discharge: 2019-03-15 | Payer: Private Health Insurance - Indemnity | Primary: Family

## 2019-03-15 DIAGNOSIS — Z421 Encounter for breast reconstruction following mastectomy: Secondary | ICD-10-CM

## 2019-03-15 DIAGNOSIS — J302 Other seasonal allergic rhinitis: Secondary | ICD-10-CM

## 2019-03-15 DIAGNOSIS — M1990 Unspecified osteoarthritis, unspecified site: Secondary | ICD-10-CM

## 2019-03-15 DIAGNOSIS — T66XXXA Radiation sickness, unspecified, initial encounter: Secondary | ICD-10-CM

## 2019-03-15 DIAGNOSIS — G4700 Insomnia, unspecified: Secondary | ICD-10-CM

## 2019-03-15 DIAGNOSIS — C50919 Malignant neoplasm of unspecified site of unspecified female breast: Secondary | ICD-10-CM

## 2019-03-15 NOTE — Progress Notes
Subjective:  HPI    Patricia Montes is a 53 y.o. female   12/09/2018 LEFT SKIN SPARING MASTECTOMY (Left)  IDENTIFICATION SENTINEL LYMPH NODE (Left)  INJECTION RADIOACTIVE TRACER FOR SENTINEL NODE IDENTIFICATION (Left)  LEFT SENTINEL LYMPH NODE BIOPSY (Left)  RECONSTRUCTION BREAST WITH SIENTRA LPP-FH15S 600-720 FILLED TO 350 CC TISSUE EXPANDER- IMMEDIATE (Left)  IMPLANTATION ALLODERM EXTRA LARGE PERFORATED CONTOUR BIOLOGIC IMPLANT FOR SOFT TISSUE REINFORCEMENT (Left)  INTRAVENOUS INJECTION AGENT TO TEST VASCULAR FLOW IN FLAP/ GRAFT (Left)  Deflated down to 50 cc pre-radiation  Completed radiation  Had significant reaction  Using Aquaphor      ROS    Current Outpatient Medications   Medication Sig Dispense Refill   ? acetaminophen (TYLENOL) 325 mg tablet Take two tablets by mouth every 6 hours. Take scheduled for 3 days after surgery, then as needed. Do not exceed 4,000mg  in a 24 hour period. 40 tablet 0   ? cetirizine (ZYRTEC) 10 mg tablet Take 10 mg by mouth every morning.     ? cyclobenzaprine (FLEXERIL) 10 mg tablet Take 10 mg by mouth every 8 hours.     ? diazePAM (VALIUM) 5 mg tablet Take one tablet by mouth every 6 hours as needed (muscle spasm). 32 tablet 0   ? DIPH/LIDO/ANTACID 1:1:1 (COMPOUND) Swish and Swallow 10 mL by mouth as directed three times daily as needed for Mouth Pain. 300 mL 3   ? diphenhydrAMINE (BENADRYL ALLERGY) 25 mg tablet Take 25 mg by mouth every 6 hours as needed.     ? ergocalciferol (VITAMIN D-2) 1,250 mcg (50,000 unit) capsule Take one capsule by mouth every 7 days. 6 capsule 0   ? fluticasone propionate (FLONASE) 50 mcg/actuation nasal spray, suspension Apply 1 spray into nose as directed every 24 hours.     ? gabapentin 300 mg Tb24 Take 1 tablet by mouth at bedtime daily.     ? guaiFENesin LA (MUCINEX) 600 mg tablet Take 600 mg by mouth twice daily as needed.     ? hydrocortisone 2.5 % topical cream Apply to affected area four times a day as needed. 28 g 3 ? lisinopriL (ZESTRIL) 5 mg tablet TK 1 TABLET PO DAILY     ? magnesium citrate oral solution Take 148 mL by mouth once.     ? multivit-min-iron-FA-lutein (CENTRUM SILVER WOMEN) 8 mg iron-400 mcg-300 mcg tab Take 1 tablet by mouth daily.     ? oxyCODONE (ROXICODONE) 5 mg tablet Take one tablet by mouth every 4 hours as needed for Pain 90 tablet 0   ? pantoprazole DR (PROTONIX) 20 mg tablet Take 20 mg by mouth at bedtime daily.     ? senna/docusate (SENOKOT-S) 8.6/50 mg tablet Take one tablet by mouth twice daily. To prevent constipation while taking pain medication. 15 tablet 1   ? silver sulfADIAZINE (THERMAZINE) 1 % topical cream Apply to affected area three times a day as needed. 400 g 3   ? traZODone (DESYREL) 50 mg tablet Take 50 mg by mouth at bedtime as needed.         Objective:    Vitals:    03/15/19 1201   BP: (!) 143/81   BP Source: Arm, Left Upper   Patient Position: Sitting   Pulse: 88   Temp: 36.7 ?C (98.1 ?F)   TempSrc: Temporal   SpO2: 98%   Weight: 87.1 kg (192 lb)   Height: 165.1 cm (65)   PainSc: Zero     Estimated body mass  index is 31.95 kg/m? as calculated from the following:    Height as of this encounter: 165.1 cm (65).    Weight as of this encounter: 87.1 kg (192 lb).  Body surface area is 2 meters squared.   Physical Exam  Left chest-moderate desquamation and radiation change  Well healed  Ready for expansion  left breast marked, prepped with Chloraprep and injected with 50 cc saline for a new total of 100 cc  Assessment:    1. Encounter for breast reconstruction following mastectomy    2. Radiation effect, initial encounter        Plan:  Continue with weekly expansions   Will see again once completed  No problem-specific Assessment & Plan notes found for this encounter.      Orders Placed This Encounter   ? SODIUM CHLORIDE 0.9 % IV SOLP [place])

## 2019-03-30 ENCOUNTER — Encounter: Admit: 2019-03-30 | Discharge: 2019-03-30 | Payer: Private Health Insurance - Indemnity | Primary: Family

## 2019-03-30 ENCOUNTER — Ambulatory Visit: Admit: 2019-03-30 | Discharge: 2019-03-30 | Payer: Private Health Insurance - Indemnity | Primary: Family

## 2019-03-30 DIAGNOSIS — M1990 Unspecified osteoarthritis, unspecified site: Secondary | ICD-10-CM

## 2019-03-30 DIAGNOSIS — G4700 Insomnia, unspecified: Secondary | ICD-10-CM

## 2019-03-30 DIAGNOSIS — J302 Other seasonal allergic rhinitis: Secondary | ICD-10-CM

## 2019-03-30 DIAGNOSIS — C50112 Malignant neoplasm of central portion of left female breast: Secondary | ICD-10-CM

## 2019-03-30 DIAGNOSIS — C50919 Malignant neoplasm of unspecified site of unspecified female breast: Secondary | ICD-10-CM

## 2019-04-01 ENCOUNTER — Encounter: Admit: 2019-04-01 | Discharge: 2019-04-01 | Payer: Private Health Insurance - Indemnity | Primary: Family

## 2019-04-07 ENCOUNTER — Encounter: Admit: 2019-04-07 | Discharge: 2019-04-07 | Payer: Private Health Insurance - Indemnity | Primary: Family

## 2019-04-07 ENCOUNTER — Ambulatory Visit: Admit: 2019-04-07 | Discharge: 2019-04-07 | Payer: Private Health Insurance - Indemnity | Primary: Family

## 2019-04-11 ENCOUNTER — Encounter: Admit: 2019-04-11 | Discharge: 2019-04-11 | Payer: Private Health Insurance - Indemnity | Primary: Family

## 2019-04-27 ENCOUNTER — Ambulatory Visit: Admit: 2019-04-27 | Discharge: 2019-04-28 | Payer: Private Health Insurance - Indemnity | Primary: Family

## 2019-04-27 ENCOUNTER — Encounter: Admit: 2019-04-27 | Discharge: 2019-04-27 | Payer: Private Health Insurance - Indemnity | Primary: Family

## 2019-04-27 DIAGNOSIS — J302 Other seasonal allergic rhinitis: Secondary | ICD-10-CM

## 2019-04-27 DIAGNOSIS — K219 Gastro-esophageal reflux disease without esophagitis: Secondary | ICD-10-CM

## 2019-04-27 DIAGNOSIS — G4700 Insomnia, unspecified: Secondary | ICD-10-CM

## 2019-04-27 DIAGNOSIS — M1990 Unspecified osteoarthritis, unspecified site: Secondary | ICD-10-CM

## 2019-04-27 DIAGNOSIS — E6609 Other obesity due to excess calories: Principal | ICD-10-CM

## 2019-04-27 DIAGNOSIS — C50919 Malignant neoplasm of unspecified site of unspecified female breast: Secondary | ICD-10-CM

## 2019-04-27 NOTE — Progress Notes
Department of Metabolic, Bariatric, and Minimally Invasive Surgery      04/27/2019    Reason for Consult: Morbid obesity, consideration for bariatric surgery     Assessment:  54 y.o. female with morbid obesity with a BMI of Body mass index is 32.28 kg/m?Marland Kitchen and obesity-related comorbidities of GERD, refractory to medical management.        Plan   I discussed with the patient the various options of bariatric surgery.  She  is most interested in the Laparoscopic sleeve gastrectomy.  Patient is somewhat undecided in regards to proceeding with surgical management.  She does not have insurance coverage and understands complications would not be covered by her insurance.    We discussed the preoperative evaluation, operative procedure, hospital stay, and postoperative diet.    We discussed the need for lifelong vitamin and mineral supplementation as well as laboratory monitoring.      We also discussed the need to avoid certain medications such as nonsteroidal medications (ibuprofen, aspirin) and steroids.  We also discussed the need for lifelong avoidance of smoking or use of any tobacco products.    The risk of surgery including but not limited to bleeding, infection, trocar related injury, anastomotic leak, anastomotic stricture, internal hernia or hernia, inadequate weight loss or rate weight gain, excessive weight loss, damage to adjacent structures, and possible need for future procedure all discussed.  We also discussed potential perioperative complications of DVT, PE, heart attack, stroke, and even death.  The patient has reviewed the EMMI patient education program and states understanding of its content.    Finally, we discussed weight loss expectations.  Patient currently has excess weight of 50 pounds.  Patient is expected to lose 60-80% of excess weight which would correspond to 30-40 pounds over a 2-3 year period following recommended guidelines of exercise and appropriate food choices. The patient had an opportunity to ask all their questions to their apparent satisfaction.      Patient understands that a small percentage of patients with history of GERD and medication for control do develop worsening of their symptoms with a sleeve gastrectomy.  Some patients may require revision to a roux-en-y gastric bypass due to uncontrolled symptoms.  We discussed option of meeting with the weight management clinic and attempting medical measures first.  She has had a majority of her weight gain in the last year or so with treatment of her breast cancer.  She is enthusiastic to explore this option first.  She understands that we would be open to further discussion of surgical intervention if she does not have reasonable results.          _____________________________________________________________________    History of Present Illness: Patricia Montes is a 54 y.o. female who is self-referred for evaluation of potential bariatric surgery.  Patricia Montes has had ongoing issues with her weight since age 14.  She has tried a multitude of prior diet and weight loss attempts, including HCG and phentermine.  Unfortunately, she has always regained lost weight and additional weight.  She is most interested in the laparoscopic sleeve gastrectomy.      Patient has history of recent diagnosis of left breast cancer, Stage 1a, triple positive, in 05/2018 and underwent left skin sparing total mastectomy with reconstruction, sentinel lymph node biopsy.  She completed chemotherapy and continues with hormonal treatment.  Patient reports she gained at least 35-40 pounds with chemotherapy.      Patient report longtime history of GERD and takes protonix.  She is  unable to miss a dose without significant symptoms.  She reports her symptoms were more manageable and minimal prior to her weight gain.    Medical History:   Diagnosis Date   ? Breast cancer (HCC) 06/15/2018    left IDC   ? GERD (gastroesophageal reflux disease) ? Insomnia    ? Osteoarthritis    ? Seasonal allergies          Surgical History:   Procedure Laterality Date   ? CLAVICLE SURGERY Left 1991   ? HX BREAST REDUCTION Bilateral 2002    with abdominoplasty   ? HX HYSTERECTOMY  2012   ? PLACEMENT OF PORT-A-CATH 8 FRENCH SINGLE-LUMEN POWERPORT Right 07/15/2018    Performed by Dellia Cloud, DO at [facility]   ? FLUOROSCOPIC GUIDANCE CENTRAL VENOUS ACCESS DEVICE PLACEMENT Right 07/15/2018    Performed by Dellia Cloud, DO at [facility]   ? TUNNELED VENOUS PORT PLACEMENT Left 07/22/2018   ? LEFT SKIN SPARING MASTECTOMY Left 12/09/2018    Performed by Neldon Mc, DO at [facility]   ? IDENTIFICATION SENTINEL LYMPH NODE Left 12/09/2018    Performed by Neldon Mc, DO at [facility]   ? INJECTION RADIOACTIVE TRACER FOR SENTINEL NODE IDENTIFICATION Left 12/09/2018    Performed by Neldon Mc, DO at [facility]   ? LEFT SENTINEL LYMPH NODE BIOPSY Left 12/09/2018    Performed by Neldon Mc, DO at [facility]   ? RECONSTRUCTION BREAST WITH SIENTRA LPP-FH15S 600-720 FILLED TO 350 CC TISSUE EXPANDER- IMMEDIATE Left 12/09/2018    Performed by Corliss Skains, MD at [facility]   ? IMPLANTATION ALLODERM EXTRA LARGE PERFORATED CONTOUR BIOLOGIC IMPLANT FOR SOFT TISSUE REINFORCEMENT Left 12/09/2018    Performed by Corliss Skains, MD at [facility]   ? INTRAVENOUS INJECTION AGENT TO TEST VASCULAR FLOW IN FLAP/ GRAFT Left 12/09/2018    Performed by Corliss Skains, MD at [facility]     Social History     Tobacco Use   ? Smoking status: Passive Smoke Exposure - Never Smoker   ? Smokeless tobacco: Never Used   Substance Use Topics   ? Alcohol use: Not Currently     Frequency: Never   ? Drug use: Not Currently     Family History   Problem Relation Age of Onset   ? Unknown to Patient Mother    ? Heart Disease Father    ? High Cholesterol Father    ? Seizures Brother    ? Arthritis-rheumatoid Maternal Aunt    ? Cancer Maternal Uncle         non Hodgkins   ? Arthritis-rheumatoid Maternal Grandmother ? Cancer-Breast Other 82        mat great gma   ? Heart Disease Other    ? Arthritis-rheumatoid Other    ? High Cholesterol Other        Allergies:    Allergies   Allergen Reactions   ? Triple Antibiotic [Neomy-Bacit-Polymyx-Pramoxine] SEE COMMENTS     Petrolium base products        Current Outpatient Medications on File Prior to Visit   Medication Sig Dispense Refill   ? acetaminophen (TYLENOL) 325 mg tablet Take two tablets by mouth every 6 hours. Take scheduled for 3 days after surgery, then as needed. Do not exceed 4,000mg  in a 24 hour period. 40 tablet 0   ? ADO-trastuzumab EMTANSINE (KADCYLA) 100 mg/5 mL injection Indications: Q 3wks     ?  calcium carbonate (CALCIUM 500 PO) Calcium 500   daily     ? cetirizine (ZYRTEC) 10 mg tablet Take 10 mg by mouth every morning.     ? cyclobenzaprine (FLEXERIL) 10 mg tablet Take 10 mg by mouth every 8 hours.     ? DIPH/LIDO/ANTACID 1:1:1 (COMPOUND) Swish and Swallow 10 mL by mouth as directed three times daily as needed for Mouth Pain. 300 mL 3   ? diphenhydrAMINE (BENADRYL ALLERGY) 25 mg tablet Take 25 mg by mouth every 6 hours as needed.     ? fluticasone propionate (FLONASE) 50 mcg/actuation nasal spray, suspension Apply 1 spray into nose as directed every 24 hours.     ? fluticasone propionate (FLOVENT DISKUS) 50 mcg/actuation inhaler every 24 hours.     ? gabapentin 300 mg Tb24 Take 1 tablet by mouth at bedtime daily.     ? guaiFENesin LA (MUCINEX) 600 mg tablet Take 600 mg by mouth twice daily as needed.     ? letrozole (FEMARA) 2.5 mg tablet Take 2.5 mg by mouth daily.     ? liraglutide (weight loss) (SAXENDA) 3 mg/0.5 mL (18 mg/3 mL) injection PEN every 24 hours.     ? lisinopriL (ZESTRIL) 5 mg tablet TK 1 TABLET PO DAILY     ? magnesium citrate oral solution Take 148 mL by mouth once.     ? multivit-min-iron-FA-lutein (CENTRUM SILVER WOMEN) 8 mg iron-400 mcg-300 mcg tab Take 1 tablet by mouth daily. ? ondansetron (ZOFRAN) 8 mg tablet Indications: Q 3wks  w/ Kydcyla infusion     ? pantoprazole DR (PROTONIX) 20 mg tablet Take 20 mg by mouth at bedtime daily.     ? senna/docusate (SENOKOT-S) 8.6/50 mg tablet Take one tablet by mouth twice daily. To prevent constipation while taking pain medication. 15 tablet 1   ? silver sulfADIAZINE (THERMAZINE) 1 % topical cream Apply to affected area three times a day as needed. 400 g 3   ? traZODone (DESYREL) 50 mg tablet Take 50 mg by mouth at bedtime as needed.       No current facility-administered medications on file prior to visit.        Review of Systems:  Review of Systems   All other systems reviewed and are negative.      Vitals:    04/27/19 1349   BP: (!) 139/99   BP Source: Arm, Right Upper   Patient Position: Sitting   Pulse: 89   Temp: 37.1 ?C (98.8 ?F)   TempSrc: Temporal   SpO2: 96%   Weight: 88 kg (194 lb)   Height: 165.1 cm (65)   PainSc: Zero     Body mass index is 32.28 kg/m?Marland Kitchen  Measurements  04/27/2019   BMI 32.2   Weight 194 lb       Physical Exam:  GENERAL:  Alert and oriented x 3, not in acute distress.  HEAD:  Normocephalic, atraumatic.  EYES:  EOM intact, no conjunctivitis or jaundice.  NECK:  Supple, no thyromegaly or bruit.  SKIN:  No rashes or ulcers appreciated  LUNGS:  Symmetric expansion, clear to auscultation bilaterally, no crackles or wheezing.  HEART:  Regular rate and rhythm  ABDOMEN:  Obese, soft, non-tender, non-distended  EXTREMITIES:  No cyanosis, clubbing or edema.  NEURO:  Moves all 4 extremities without difficulty, gait is appropriate.  PSYCH:  Appropriate mood and behavior.      Lab/Radiology/Other Diagnostic Tests:  No results found for this or any previous visit.  No results found for this or any previous visit.    Sitting and Reading: High chance of dozing  Watching TV: Slight chance of dozing   Sitting, Inactive in Public Place: Moderate chance of dozing   Passenger in Car/1 hr no break: Slight chance of dozing Lying Down to Rest: Moderate chance of dozing   Sitting & Talking to Someone: Slight chance of dozing   Sitting Quietly After Lunch: Slight chance of dozing   In a Car/Stopped in Traffic: Slight chance of dozing   Epworth Sleepiness Scale: 12       Loud Snoring: Yes  Tired/Fatigued/Sleepy in Daytime: No  Stop Breathing During Sleep: No  Treated for High Blood Pressure: Yes  BMI >35 kg/m2: No  Over 11 Years Old: Yes  Neck Size 16 in or More: No  Is the Patient Female?: No  STOPBANG Score: 3        Staff name:  Rudean Haskell, MD Date:  04/27/19

## 2019-04-27 NOTE — Patient Instructions
For routine questions during business hours please call [phone]. If you have an emergent question, please ask to speak with the physician on call.      Required Steps to Be a Candidate for Bariatric Surgery    ? A psychological evaluation and clearance for bariatric surgery.     ? Vitamin protocol to begin after initial consultation, list of vitamin formulations provided in this after visit summary.    ? You must be a non-smoker. Anyone with a history of nicotine use within the past 5 years will be subject to urine nicotine testing. These will be collected at consultation AND at the pre-operative visit. If level is positive, appointment/surgery will be rescheduled.    ? Dietary evaluation by a Registered Dietician, who has appointments available in clinic.    ? Agreement that patient will comply with long term follow-up appointment plan once the surgery has been completed.    The items listed below may be requested prior to surgery. Your surgeon will explain any additional requirements to you:    ? __   Home sleep apnea test     ? __   Pulmonology clearance     ? __   Cardiology clearance     ? __   CPAP pick-up as applicable and compliance    ? __   EGD, upper GI or H. Pylori testing      For any questions regarding these requirements or to notify us that these requirements have been completed, please call [phone] and ask to speak to Donnie Mesa, Bariatric Coordinator.      Bariatric Program Use of Tobacco Products Agreement  [facility] has a strict policy prohibiting the use of tobacco products for everyone considering weight loss surgery. Anyone who has used tobacco products within the past three months will be subject to a urine nicotine level test at their appointment(s). Patients who continue to use any type of tobacco products, are suspected of using any type of tobacco products, or who smell of smoke prior to weight loss surgery can expect to have their urine nicotine level checked, and surgery will be canceled if positive. Weight loss surgery is a surgery and patients who use tobacco products are at high risk for problems after surgery, including not healing properly from the surgery and readmission to the hospital for follow-up surgeries to repair damage. Smoking and/or the use of tobacco products greatly reduce the potential that our patients will gain long-term benefits from weight loss surgery. As a department who desires the best possible outcomes for our patients, we do not believe in performing surgery on patients who continue smoking and/or using tobacco products.    ? I understand that smoking and/or the use of tobacco products creates unacceptable risks to my health.   ? I understand that smoking and/ or the use of tobacco products frequently cause stomach ulcers and perforation of the stomach after weight loss surgery has been performed.   ? I understand that continued smoking and/ or the use of tobacco products will disqualify me from consideration for weight loss surgery.   ? I understand that if it is determined that I have continued to smoke and/ or use tobacco products at any time up to and including the day of my surgery, that my surgery will be cancelled.   In the interest of my health, if any member of the Bariatric Program has any reason to believe that I have smoked and/or used  any tobacco products following my initial consultation to discuss weight loss surgery, or a positive nicotine level is found at my pre-operative visit, I grant my surgeon the full authority to cancel my surgery without recourse or appeal.    I have received a paper copy of this standard, and verbally informed of this standard at time of visit.      Locations and hours Patient-centered care  We value comfort, safety and fast results. All of our outpatient laboratory locations offer same-day  collection and testing by [facility]. Locations feature walkin  service. No appointment is necessary.    [facility]  [place], [place]  [address] [address]   ? Lobby attendant  will direct pt where to go.  ? Monday-Friday 7:00am-5:30pm  ? Saturday-Sunday ? 7:00am-12:00pm    [place], [place]  Medical Office Building  [address]  [place], [place] [address]  At [address] adjacent to  [facility]  [phone]  ? 6:30 a.m.-6 p.m. Monday  ? 7 a.m.-6 p.m. Tuesday-Friday  ? 7 a.m.-noon Saturday. Patients visiting for Saturday service should park on the third level [address] located at the corner of [address] and [address] and follow the skywalk to  radiology for check-in and registration on the second floor.    [place], [place]  [address] [place] [address]  [phone]  ? 8 a.m.?4:30 p.m. Monday-Friday    [place], [place]  [facility]  [address] [place] [address]  ? Appointments required. Please call for appointment [phone]  ? 8 a.m.-4 p.m. Monday-Friday   ? Conveniently located [address] of [address] With the building entrance on the first right.  ? Ask at the reception desk to be directed to lab    [place]  [facility]  [address]  [place], [place] [address]  [phone]  ? 8 a.m.-5 p.m. Monday-Friday    [place]  [facility]  [address]  [place], [place] [address]  [phone]  ? 8 a.m.-4:45 p.m. Monday-Friday  Locations are closed all major holidays (excluding Veterans Day).          Sleeve Gastrectomy    Vitamin/Mineral Supplementation     Option # 1     OR                 Option # 2 Bariatric Multivitamin* (see below)  1 or 2 chewable tablets daily for the first six weeks  Then either chewable or capsule     Sublingual B12  500 ? 1000 mcg daily      Multivitamin Plus Patch   (available through [email or web address])  Apply one patch daily       Pepcid Complete  Take twice a day for the first six weeks  then discontinue Pepcid and begin     60 0mg  Calcium Citrate plus 400IU Vitamin D (beginning at six weeks post-operative visit)  Take twice a day for a total of  1200mg  of Calcium and 800IU of Vitamin D daily  Pepcid Complete  Take twice a day for the first six weeks        Ferro-Sequels (iron fumarate)  45-60mg  daily  For menstruating females only or if labs indicate need  May be separate  or combined in Multivitamin              *Any Multivitamin source that is used must contain the minimum quantities listed below for each specific nutrient:    Vitamin B1 (Thiamine) 12mg  daily   Vitamin E 15 IU daily      Folate   daily   Vitamin K daily  Vitamin A   5,000 IU daily   Zinc  >11 mg daily   Vitamin D   3,000 IU daily       Copper  2mg  daily         We have found several products that meet the above criteria:  o ProCare Health Bariatric Multivitamin (website: procarenow.com, Amazon, [employer] Online)  o Celebrate Bariatric Multivitamin (website: Celebratevitamins.com, Amazon, Insurance underwriter)  o Bariatric Advantage Multivitamin (website: BariatricAdvantage.com, Amazon)        Roux-en-Y Gastric Bypass (RNYGB)    Vitamin/Mineral Supplementation     Option # 1     OR                 Option # 2     Bariatric Multivitamin* (see below)  1 or 2 chewable tablets daily for the first six weeks  Then either chewable or capsule     Sublingual B12  500 ? 1000 mcg daily      Multivitamin Plus Patch   (available through [email or web address])  Apply one patch daily       Pepcid Complete  Take twice a day for the first six weeks  then discontinue Pepcid and begin 600mg  Calcium Citrate plus 400IU Vitamin D (beginning at six weeks post-operative visit)  Take twice a day for a total of  1200mg  of Calcium and 800IU of Vitamin D daily  Pepcid Complete  Take twice a day for the first six weeks        Ferro-Sequels (iron fumarate)  45-60mg  daily  May be separate or combined in Multivitamin              *Any Multivitamin source that is used must contain the minimum quantities listed below for each specific nutrient:    Vitamin B1 (Thiamine) 12mg  daily   Vitamin E 15 IU daily      Folate   daily   Vitamin K daily  Vitamin A   5,000 IU daily   Zinc  >11 mg daily   Vitamin D   3,000 IU daily       Copper  2mg  daily         We have found several products that meet the above criteria:  o ProCare Health Bariatric Multivitamin (website: procarenow.com, Amazon, Walmart Online)  o Celebrate Bariatric Multivitamin (website: Celebratevitamins.com, Amazon, Insurance underwriter)  o Bariatric Advantage Multivitamin (website: BariatricAdvantage.com, Amazon)        Biliopancreatic Diversion with Duodenal Switch (BPD/DS)    Vitamin/Mineral Supplementation      Option # 1     OR Option # 2        Bariatric High ADEK Multivitamin* (see below)  3 chewable tablets daily for the first 6 weeks, then either 3 ADEK Multivitamin chewables or capsules daily  Multivitamin Plus Patch   available through [email or web address]  Apply one patch daily     Sublingual B12  500 ? 1000 mcg daily    Ferro-Sequels (iron fumarate)  45-60mg  daily  May be separate or combined in Multivitamin    Pepcid Complete  Take twice a day  for the first six weeks  then discontinue Pepcid and begin    600 mg Calcium Citrate plus 400 IU Vitamin D  (beginning at six weeks post-operative visit)  Take three times a day for a total of   1800 mg of Calcium & 1200 IU of Vitamin D daily    Ferro-Sequels (iron fumarate)  45-60mg  daily   May be separate or combined in Multivitamin      Pepcid Complete  Take twice a day for the first six weeks then discontinue Pepcid and begin     600 mg Calcium Citrate plus 400 IU Vitamin D (beginning at six weeks post-operative visit)  Take three times a day for a total of  1800 mg of Calcium & 1200 IU of Vitamin D daily              *Any Multivitamin source that is used must contain the minimum quantities listed below for each specific nutrient:    Vitamin B1 (Thiamine) 12mg  daily   Vitamin E 15 IU daily      Folate   daily   Vitamin K 300 mcg daily  Vitamin A   10,000 IU daily   Zinc  >11 mg daily   Vitamin D   3,000 IU daily      Copper  2mg  daily             We have found several products that meet the above criteria:  o Bariatric Advantage High ADEK Multivitamin (website: BariatricAdvantage.com, Amazon.com)  o DEKAs Bariatric Multivitamin (website: callionpharma.com, Amazon.com)  o Celebrate ADEK Bariatric Multivitamin (website: Celebratevitamins.com, Amazon.com, Walmart.com)            Deciding on Bariatric Surgery    Is excess weight affecting your life and your health? Bariatric surgery (also called obesity surgery) may help you reach a healthier weight. This surgery alters your digestive system. For the surgery to work, you must change your diet and lifestyle. In most cases, the surgery is not reversible. So if you?re considering surgery, learn all you can about it before you decide. Bariatric surgery also has a number of potential risks and complications that you need to discuss with your surgeon.  Qualifying for surgery  Surgery is not for everyone. To qualify:  ? You must have a BMI of 40 or more, or a BMI of 35 or more (see box below) plus a serious obesity-related health problem, such as type 2 diabetes, high blood pressure, or sleep apnea.  ? You must be healthy enough to have surgery.  ? You may be required to have a psychological evaluation.  ? You must have tried to lose weight by other means, such as dieting.  ? You must be a non-smoker.  Setting realistic expectations The goal of bariatric surgery is to help you lose over half of your excess weight. This can improve or prevent health problems. This surgery is not done for cosmetic reasons. Keep in mind that:  ? Other weight-loss methods should be tried first. Lifestyle changes, behavioral modifications, and prescription medicines are initial choices. Surgery is only a choice if other methods don?t work.  ? Surgery is meant to be permanent. You will need to change how you eat for the rest of your life.  ? You must commit to eating less and being more active after surgery. If you don?t, you will not lose or keep off the weight.  ? You won?t reach a healthy weight right away. Most weight is  lost steadily during the first year or two after surgery.  ? Most likely, you won?t lose all your excess weight. But you can reach a much healthier weight.  Obesity is measured by a formula called body mass index (BMI), which is based on your height and weight. A healthy BMI is about 18 to 25. A BMI of 30 or more signals obesity. A BMI of 40 or more reflects severe (morbid) obesity.  Resources  ? American Society for Metabolic and Bariatric Surgerywww.asmbs.org  ? National Heart, Lung, and Blood Institute Obesity Education Initiativewww.MLSLibrary.pl  StayWell last reviewed this educational content on 05/11/2014  ? 2000-2019 Nancie Neas [address], [place], [place] [address]. All rights reserved. This information is not intended as a substitute for professional medical care. Always follow your healthcare professional's instructions. This information has been modified by your health care provider with permission from the publisher.        Bariatric Surgery: Roux-en-Y Gastric Bypass Bariatric surgery changes the size of your stomach and the length of your small intestine that comes in contact with the food you eat. The goal is to limit how much food can be eaten and/or absorbed at one time. Bariatric surgery can be done in several ways. You are having a Roux-en-Y Gastric Bypass (RYGB). This is the most common type of bariatric surgery. During this type of procedure, part of the stomach is separated from the rest of the stomach and closed off with staples to create a smaller pouch. The smaller stomach helps restrict the amount of food you can eat at one time. The small intestine is then divided, and part of it is reattached to the stomach pouch. Because some of the small intestine is bypassed, less food is absorbed as well.  The procedure  A large portion of the stomach is closed off. This leaves a small pouch to hold food, restricting the amount that can be eaten at one time. The small intestine is cut below the duodenum and reattached to the new stomach pouch. This leaves a shortened path for food to travel through. As a result, some of the food that is eaten is expelled as waste and not absorbed as energy. The remaining pouch of the stomach and duodenum are also reattached to the small intestine farther down to drain stomach, biliary and pancreatic enzymes, and fluids.   Possible gallstones  Bariatric surgery is designed to cause a large amount of weight loss. Rapid weight loss can cause deposits in the gallbladder called gallstones. The gallbladder may need to be removed at a later date. If you already have gallstones, the gallbladder may be removed at the time of your RYGB operation.  All types of bariatric surgery have different advantages and disadvantages. Be sure to discuss the risks and complications of this surgery, and any alternatives with your healthcare provider.   StayWell last reviewed this educational content on 06/15/2014 ? 2000-2019 [employer], [place]. [address], [place], [place] [address]. All rights reserved. This information is not intended as a substitute for professional medical care. Always follow your healthcare professional's instructions.        Bariatric Surgery: Vertical Sleeve Gastrectomy    Bariatric surgery is a procedure that helps you lose weight. During vertical sleeve gastrectomy, approximately 80% of the stomach is removed. The part of the stomach that is removed is thinner than the rest of the stomach and stretches out when you eat to hold all the food. The part of the stomach  that is not removed is a narrow sleeve. This sleeve does not stretch very much and can hold only a few tablespoons of food. Food passes slowly through a narrow opening at the bottom of the stomach called the pyloric valve, and food empties out like it does before surgery.This procedure produces changes in hormones that reduce appetite. In the sleeve gastrectomy, the surgery is only on the stomach and is not on the small bowel.   The procedure  This surgery can be done using one or two approaches:  ? For laparoscopic surgery, small incisions are made in your upper stomach. During the procedure, a tiny camera sending images to a monitor and thin surgical instruments are inserted through these small incisions. The surgeon operates by looking at the organs on a video monitor. Almost all sleeve gastrectomy procedures are now done laparoscopically.   ? For open surgery (also called laparotomy), one larger incision is made in the middle of the upper stomach. The surgeon sees and works through this incision. Most cases are not done this way because the laparoscopic approach, if possible, involves smaller cuts, less tissue damage, and fewer problems. Using either type of approach, the stomach is cut lengthwise (up and down) with a stapling instrument. The larger part of the stomach is removed. The side of the remaining stomach (the sleeve) is closed off with staples made out of titanium.  This creates a narrower, smaller-volume stomach in the shape of a banana, or a sleeve.   Special note: the gallbladder   Bariatric surgery is designed to cause a large amount of weight loss. Weight loss can cause gallstones. If you already have gallstones, the gallbladder may be removed during your surgery or at a later date to prevent gallbladder problems from developing.    StayWell last reviewed this educational content on 06/16/2014  ? 2000-2019 [employer], [place]. [address] [place], [place] [address]. All rights reserved. This information is not intended as a substitute for professional medical care. Always follow your healthcare professional's instructions.        Biliopancreatic Diversion Surgery with Duodenal Switch (BPD/DS)     Bariatric surgery can be done in several ways. This type of bariatric surgery changes the size of your stomach and the length of your small intestine. The goal is to limit how much food can be eaten and absorbed at one time. During this type of procedure, part of the stomach is closed off with staples to create a smaller pouch. The remainder of the stomach is removed. This smaller stomach restricts the amount of food you can eat at one time. The small intestine is then divided, and only a very short length of its lower portion is reattached to the stomach pouch. Because part or most of the small intestine is bypassed, less food is absorbed.  The procedure  Only a very short length of intestine is left that can absorb food. As a result, most of the food that is eaten is expelled as waste and not absorbed as energy. This can lead to nutritional deficiencies.  Special note: the gallbladder Bariatric surgery is designed to cause a large amount of weight loss. Weight loss can cause deposits in the gallbladder called gallstones. The gallbladder may be removed during your surgery if you already have gallstones. Or it may need to be removed at a later date if you develop gallstones.  All types of bariatric surgery have different advantages and disadvantages. Be sure to discuss the risks  and complications of this surgery with your healthcare provider.    StayWell last reviewed this educational content on 06/16/2014  ? 2000-2019 [employer], [place]. [address] [place], [place] [address]. All rights reserved. This information is not intended as a substitute for professional medical care. Always follow your healthcare professional's instructions.        Choosing a Bariatric Surgery Procedure  Bariatric surgery is a type of surgery to help you lose weight when diet and exercise alone has not been successful. It is a choice for some people who are obese and have health problems, such as diabetes, heart disease, arthritis, and sleep apnea. Diabetes and some other health problems may get better with weight loss.  Comparing surgery with medical treatment  People who have bariatric surgery tend to lose much more weight than people who get medical treatment for their weight loss. This means that surgery is more likely to help with health conditions linked to obesity. These may include diabetes, heart disease, arthritis, or sleep apnea. But the results vary. Some people can have large weight loss with medical treatment alone. And some people don?t lose as much weight as they want after surgery.  Types of bariatric surgery  Surgeons do bariatric surgery using a number of different methods. The type of bariatric surgery that works best for you will depend on several factors. These include your general health, your medical needs, and your own preference. The types of surgery include: ? Lap banding. This surgery is also known as laparoscopic adjustable gastric banding or LAGB. During lap banding your surgeon places an adjustable band around the top of your stomach. Your surgeon also places a small device called a port under the skin of your stomach. A thin tube leads from the band to the port. Fluid is injected into the port and flows to the band to make it squeeze tighter around the top of the stomach. Or fluid can be removed through the port to loosen the band. The band around your stomach reduces the amount of food that you can eat at one time.  ? Gastric bypass. This is also called a Roux-en-Y gastric bypass. This surgery also reduces the amount of food you can eat at one time. And it reduces the number of calories and nutrients you can absorb from the foods you eat. During gastric bypass, your surgeon separates part of the stomach to create a small pouch. The pouch is then attached to a part of your small intestine. This small pouch holds less food, making you feel full faster. As food bypasses the rest of the stomach and upper part of your small intestine, you absorb fewer calories and nutrients.  ? Sleeve gastrectomy. This is a type of surgery that removes up to 85% of the stomach. It?s also known as a gastric sleeve. The surgery turns the stomach into a narrow tube that looks like a sleeve. The sleeve holds much less food, and you feel full faster. Your stomach also makes less of the main hormone that causes hunger.  ? Biliopancreatic diversion with duodenal switch (BPD/DS). This is a less common type of weight-loss surgery. In this procedure, your surgeon removes part of the stomach to create a gastric sleeve, as with the sleeve gastrectomy. The sleeve is then attached to a part of the lower small intestine. The sleeve holds much less food, and your body absorbs far fewer calories and nutrients from food.  Advantages and disadvantages of each type of surgery  Type of surgery Advantages Disadvantages   Lap banding ? Lap banding is a simpler surgery  ? After lap band surgery, it is fairly easy to loosen or tighten the band  ? A tighter band might help you feel fuller sooner and help you lose weight  ? Your risk for serious complications right after your surgery is low  ? It can lead to loss of about half of a person?s excess body weight after 2 years ? You may be more likely to need a follow-up surgery  ? Not right for you if you think you?ll have a hard time following a nutritional program  ? You may need to see your healthcare provider more often after this surgery  ? You may need to have band removed or replaced because of leaking or other problems  ? Band may slip out of position  ? May cause nausea, vomiting, acid reflux, and trouble swallowing   Gastric bypass ? Tends to be a very successful surgery  ? Can lead to loss of about two-thirds of a person?s excess body weight after 2 years ? Increased risk for complications  ? You are more likely to have nutritional problems with vitamin B12, folate, and iron  ? Can cause dumping syndrome   Sleeve gastrectomy ? Less complex than a gastric bypass  ? Fewer complications than gastric bypass  ? Better at controlling hunger than lap banding  ? Can lead to loss of about two-thirds of a person?s excess weight ? You may have difficulty absorbing certain nutrients  ? You may develop narrowing (strictures) in your intestines  ? Increased risk for abdominal hernias   BPD/DS ? Most helpful for a person who is extremely obese  ? A choice for people who haven?t had much success with other weight-loss surgery  ? Leads to the highest amount of weight loss ? Has a higher risk for complications than other weight-loss surgeries  ? Reduces the absorption of essential vitamins and minerals  ? High risk for deficiencies of calcium, iron, and fat soluble vitamin (A, D, E, and K)  ? High risk for developing protein-energy malnutrition General risks of bariatric surgery  All surgery has risks. Your risks may vary according to your general health, your age, the type of surgery you choose, and the amount of weight you need to lose. Talk with your healthcare provider about the risks that most apply to you. Risks of bariatric surgery include:  ? Bleeding  ? Infection  ? Blockage of your bowels (intestinal blockage)  ? Blood clots in your legs that may travel to your lungs or heart   ? Heart attack  ? Need for follow-up surgery  ? Gallstones (a later complication)  ? Nutritional problems (a later complication)  ? Mental health problems after the procedure  ? Weight regain  The post-surgery diet  You will get instructions about how to adapt to your new diet after your surgery. You will likely be on liquid nutrition for a few weeks after surgery. Over time, you?ll start to eat soft foods and then solid foods. If you eat too much or too fast, you will likely have stomach pain or vomiting. You?ll learn how to know when your new stomach is full.  Your healthcare provider or nutritionist will give you more instructions about your diet. These may vary depending on the type of surgery you had. You?ll need to learn good habits like choosing healthy foods and not skipping meals. Your healthcare  provider or nutritionist may also need to screen you for low levels of certain nutrients. This is more of a problem with gastrectomy, gastric bypass, and BPD/DS surgery.  Managing your health after surgery  You may need to work with your healthcare providers ongoing to stay healthy. This depends on what type of surgery you have. Your medical team will keep track of your health, especially as you lose weight quickly in the first 6 months or so after your surgery. Weight loss tends to be at its peak around a year after surgery.  Talk with your healthcare provider about your goals Work with your healthcare provider to see which surgery may work for you. It?s important to have sensible goals about what bariatric surgery might achieve for you. Some people may still be somewhat overweight a year or two after their surgery. Even if you don?t lose all of your excess weight, health issues such as high blood pressure should get better. You may be able to reduce the amount of medicines that you need to take.  Talk with your healthcare provider. Ask questions and express your concerns. Together you can decide the right treatment for your needs.  StayWell last reviewed this educational content on 06/16/2014  ? 2000-2019 [employer], [place]. [address] [place], [place] [address]. All rights reserved. This information is not intended as a substitute for professional medical care. Always follow your healthcare professional's instructions.        Before Bariatric Surgery  Bariatric surgery is a type of surgery to help you lose weight. It's an option for some people who are very obese and for people who are obese and have obesity-related health problems, such as diabetes, heart disease, high blood pressure, arthritis, or sleep apnea and have not been able to lose weight by other methods. Diabetes and some other health problems can get better with weight loss.   Being approved for bariatric surgery   Before having surgery, you?ll meet with a team of healthcare providers. They will make sure the surgery is the right choice for you. They will look at your general health, your emotional health, and other factors. If they approve you for the surgery, you?ll need to get ready for it. You may need to meet with members of your healthcare team several times in the months leading up to your surgery.   Lifestyle changes before bariatric surgery You?ll need to make some healthy lifestyle changes in the months before your surgery. You?ll also need to plan for changes in diet, exercise, and lifestyle after the surgery. This will lower your chance of complications after surgery. You will likely need to:   ? Go on a special diet. Two or 3 months before your surgery, your surgeon or dietitian will put you on a diet. This will help you lose some weight before your surgery. Losing some weight before the surgery will reduce your risk for surgery complications. This diet will be high in protein and low in carbohydrates and fat. This diet will help keep you from losing too much muscle mass. Your healthcare provider may also have you keep a food diary during this time. Your surgeon or dietitian can give you details about what diet you should follow. This diet may be similar to the one that you will need to follow after your surgery. In some cases, you may be told to follow a low-calorie liquid diet for a couple of weeks before your surgery.  ? Start exercising. Your healthcare  team might also have you start an exercise program. This may also help you lose weight before your surgery. It can also help start positive habits you will need to keep up after your procedure.  ? Stop smoking. If you smoke, you?ll need to stop smoking before your surgery. Smoking raises the risk for complications after surgery. Talk with your healthcare provider about ways to help you quit. Many bariatric surgeons will not do surgery on people who are still smoking.  ? Get counseling. Some people may benefit from counseling as they get ready for surgery. Many people who are obese have disordered eating habits. These often have an emotional link. Working on any emotional concerns with a therapist may help you have a better result after surgery. Counseling also helps you set realistic expectations for life after surgery. Your healthcare provider may have more instructions about how to get ready for your surgery. Make sure to follow all of his or her advice.   Getting ready for your surgery   Talk with your healthcare provider how to prepare for your surgery. Tell him or her about all the medicines you take. This includes over-the-counter medicines such as ibuprofen. It also includes vitamins, herbs, and other supplements. You may need to stop taking some medicines before the surgery, such as blood thinners and aspirin. Follow the advice on how far in advance they should be stopped.   Follow any directions you are given for not eating or drinking before surgery.  Before your surgery, you may need tests such as:  ? Electrocardiogram (ECG), to check your heart rhythm  ? Blood tests, to test for infection or other conditions  ? Nutritional tests, to see whether you are deficient in certain nutrients  Tell your healthcare provider if you:   ? Have had any recent changes in your health, such as an infection or fever  ? Are sensitive or allergic to any medicines, latex, tape, or anesthetic drugs (local and general)  ? Are pregnant or think you could be  Also, make sure to:   ? Ask a family member or friend to take you home from the hospital. You can't drive yourself.  ? Follow any directions you are given for taking medicines and for not eating or drinking before surgery.  ? Discuss with your healthcare provider in advance your list of medicines that you take, and which ones should be taken the morning of the surgery with a sip of water.   ? Follow all other instructions from your healthcare provider.  You will be asked to sign a consent form that gives your permission to do the surgery. Read the form carefully. Ask questions if something is not clear.   Reasons a surgery may be postponed or canceled   Even after you?ve been approved for surgery, it may need to be delayed if: ? You have a new health issue like fever, cough, cold, or a new heart problem right before your surgery  ? You didn?t adopt new lifestyle changes before surgery, and you gained weight during this time  ? A mental health provider thinks you are not psychologically ready for the surgery  ? You missed preoperative appointments  ? You changed your mind about having the surgery  Your medical team will only do the surgery if they know it will give you health benefits. For the surgery to be a success, you will need to make lifelong changes to your diet and lifestyle. Your medical team will want  to know that you are ready for the life changes that go with surgery.   Even if your surgery gets postponed, you might be able to have it at a later date. Talk with your healthcare provider about why your surgery was postponed. Ask what you can do to increase your chances of having the surgery at a later date.   StayWell last reviewed this educational content on 06/15/2014  ? 2000-2019 [employer], [place]. [address] [place], [place] [address]. All rights reserved. This information is not intended as a substitute for professional medical care. Always follow your healthcare professional's instructions.        Evaluation for Bariatric Surgery  Bariatric surgery is a type of surgery to help you lose weight. It is a choice for some people who are obese and not able to lose weight with other methods. It may also be a choice if you are overweight and have a health problem such as diabetes. Diabetes and some other health problems can get better with weight loss.  Benefits of bariatric surgery  Bariatric surgery may help you lose a large amount of extra weight and keep it off. This can help prevent or treat health problems associated with obesity such as:  ? Diabetes  ? Osteoarthritis  ? High blood pressure  ? Heart disease  ? Stroke  ? Sleep apnea  ? Liver disease  ? Infertility  ? Certain lung diseases such as asthma ? Certain cancers  ? Psychological problems  In some cases, a health problem linked to weight may go away after weight loss. People who get surgery tend to lose more weight than people who get medical treatment for their weight loss. This means that surgery is more likely to help with problems linked to obesity, such as diabetes or sleep apnea. But the results are not the same for everyone. Some people do not lose as much weight as they want after surgery.  Risks of bariatric surgery  All surgery has risks. Your risks may be different according to your general health, your age, the type of surgery you choose, and the amount of weight you need to lose. Talk with your healthcare provider about the risks that most apply to you. Risks of bariatric surgery include:  ? Bleeding  ? Infection  ? Blockage of your bowels (intestinal obstruction)  ? Blood clots  ? Heart attack  ? Need for follow-up surgery  ? Gallstones (a later problem)  ? Nutritional deficiencies (a later problem)  ? Psychological difficulties after the procedure  ? Regain of some weight after surgery  Is bariatric surgery right for you?  Healthcare providers most often advise bariatric surgery to people with a body mass index (BMI) of 40 or more. BMI is a method of screening for a weight category using a person?s height and weight for calculation. A BMI of 25 to 29.9 means overweight. A BMI higher than 30 means obese. Your healthcare provider may also suggest bariatric surgery if your BMI is between 35 and 40 and you have a health problem that may get better with weight loss.  Bariatric surgery is not the only treatment for obesity. Your healthcare provider may want you to try other treatments first. These may include working with a dietitian or using a weight-loss medicine. Your healthcare team will only do the surgery if they know it will give you health benefits. For the surgery to be a success, you will need to make lifelong changes to  your diet and lifestyle. Your healthcare team will want to know that you are ready for the life changes that go with surgery.  The approval process  You may need to go through an in-depth process to be approved for bariatric surgery. This is done to find out if you are ready for the surgery, and if it will help you. And you will need to find out if your health insurance plan will cover the costs of the surgery.  You?ll need to meet with healthcare providers such as:  ? A surgeon, who must confirm that you are a good candidate for surgery  ? A mental healthcare provider, who can look at your emotional readiness for the surgery  ? A dietitian, who will look to see if you are ready to make the needed changes  ? A primary healthcare provider, who can look at your general health and readiness for surgery  ? A heart healthcare provider, who can make sure your heart is healthy for surgery  ? A lung healthcare provider, who can make sure your lungs are healthy for surgery  All of these healthcare providers must agree that the surgery is safe for you.  Before your approval, you will also need to:  ? Lose some weight before your surgery, to prevent problems  ? Stop smoking  ? Work with healthcare providers to treat medical or emotional problems  A big decision  Bariatric surgery is a big step. It will change your eating habits for the rest of your life. Make sure you fully understand the risks and benefits. Ask for support from your partner, family, and friends.     Tests before bariatric surgery  You will need some exams and tests. These are to make sure you are healthy enough for the surgery and recovery. You may need tests such as:  ? Blood tests to check for anemia, infection, hormone levels, and kidney function  ? Screening for nutrient deficiencies ? Chest X-ray, to help make sure your heart and lungs are healthy  ? Echocardiogram, if more information about your heart is needed  ? Electrocardiogram, to look at your heart rhythm  ? Pulmonary function tests, to see of your lungs are healthy  Having realistic weight loss goals  It?s important to have a realistic weight-loss goal for your surgery. Most people do not lose all of their extra weight after surgery. You may lose one-half to two-thirds of your excess body weight. This depends on the type of surgery you have. Your healthcare provider can give you a more exact idea of what you can expect.  Losing this amount of weight may help any health conditions you have, even if you still have extra weight. You may be able to reduce or stop some of the medicines that you take. You may have more energy and a better self-image. Having a realistic weight-loss goal can help keep you motivated. It can help keep you on track with your good eating habits. Your healthcare providers will give you a realistic idea of how much you can expect to lose after your surgery.  StayWell last reviewed this educational content on 05/12/2014  ? 2000-2019 [employer] [address], [place], [place] [address]. All rights reserved. This information is not intended as a substitute for professional medical care. Always follow your healthcare professional's instructions. This information has been modified by your health care provider with permission from the publisher.        Habits to Break Before  Bariatric Surgery     Habits can be hard to break. For the best results from your weight loss surgery, change is necessary. Here are a few habits you should break, before bariatric surgery.  By breaking these habits, it will help you achieve your weight loss goals.  Eating too fast.  ? It takes your body 15 to 20 minutes to register that you?ve eaten enough. ? Eating too fast does not allow you to recognize you?re full.  You will tend to overeat because your body does not know it is full.  ? Make it a habit to eat slower by taking small bites and chewing each bite well.  ? Try take at least 20 minutes to eat.  Grazing/snacking   ? Snacking on low value food items is any easy way to eat extra calories and eat more than you need.  ? Plan your meals throughout the day.  ? Avoid eating snacks that are pre-packaged in a box or a bag, these are typically high calorie and  highly processed food.  ? If you do eat pre-packaged items, don?t eat straight from the box or bag. For example, if you?re eating chips straight out of the bag, it?s hard to keep track of how much you?ve eaten.  Eating large portions  ? Many restaurants serve huge portions.  We are used to eating large portions when eating out or at home.  ? Ask for a  to-go box when you order your meal.  Put half of your meal into the box before you start to eat.  ? At home, measure out your own meals. Slow down your eating.  Take at least  20 minutes to eat.  ? You don?t have to clear your plate or eat all the food that?s in front of you.  You just have to eat until your hunger is satisfied.  Mindless eating.  ? We binge eat or we eat emotionally, or socially, or out of boredom.  ? Be aware of your hunger cues.  ? You do not have to eat at traditional ?meal times? just because that?s what you?re used to.  Eat because you are hungry.  ? Mindless eating can have horrible effects on your weight loss efforts and is totally preventable.  ? Pay attention to your body so that you recognize when you?re actually hungry.  Avoid eating when you?re  bored or stressed.  ? If you feel the urge to eat, even though you?re not hungry, trying to take your mind off food by staying busy with something else.  ? Thirst can be mistaken for hunger, get a drink of water.  Last minute meal planning. ? Break this habit by planning what you?ll eat and when.  ? Not planning your meals, makes you likely to grab something unhealthy.  ? If you wait too long to eat, you may be ?starving? and end up overeating.  ? Never go grocery shopping when you are hungry.  ? At the grocery store:  ? shop the outside aisles of the store.  This is where the fresh food is located.  ? Avoid putting unhealthy foods in the cart. If you have unhealthy food in the house, you will probably eat it.  Stop drinking liquids with your meals.   ? Get in the habit of not having anything to drink 30 minutes before, during, and after your meal.   ? Any liquids with your meals will fill up your new stomach and you  will not be able to eat your solid food without feeling full.    ? Drinking with your meals also flushes your meal through your system too quickly.  Food moving through your system too fast does not give it time to absorb the nutrients from your meal.  Bariatric surgery can help you feel fuller sooner, but it won?t fix any poor eating habits you already have. You must commit to making that change yourself. You should break these eating habits before your bariatric surgery so you don?t compromise your weight loss goals.         Healthcare Providers Who Specialize in Obesity  Some healthcare providers specialize in treating patients who are obese or overweight. These healthcare providers are called bariatric healthcare providers or bariatricians. Some of these healthcare providers may also be bariatric surgeons. Bariatric surgeons are trained to do surgery that aids in weight loss. Obesity is when body fat is above a certain level. Body mass index (BMI) is a common way to measure obesity. BMI is a measurement that uses a person's height and weight to determine a weight category. A BMI of 25 to 29.9 means overweight. A BMI higher than 30 means obese. Your healthcare provider can calculate your BMI for you. You can also ask your healthcare provider to teach you how to calculate BMI yourself.  Obesity can cause health problems such as diabetes and sleep apnea. Losing weight can lower the risk of having these health problems. A general healthcare provider can offer treatment for weight loss. Bariatric healthcare providers have more training in how to treat obesity. They have often had special training after medical school. Many of them take extra exams to earn board certification in bariatrics. Some of them also have training to do weight loss surgery.  A bariatric healthcare provider uses a broad treatment plan. He or she will tailor your plan to meet your needs. Your plan will include aspects such as nutrition, exercise, behavior changes, and medicines. He or she may advise weight loss surgery. A bariatric treatment plan is done to treat obesity, and also the health conditions linked to obesity.  Obesity has become a common, serious, and costly problem in the [place] Approximately one third of adults in the [place] are obese. Because of this, the role of bariatric healthcare providers has become more active in recent years.  Why might I need to see a healthcare provider who specializes in obesity?  If you are obese, it?s important that you get the right treatment. Obesity can lead to a number of serious health conditions, including:  ? Diabetes  ? Arthritis  ? High blood pressure  ? Heart disease  ? Stroke  ? Sleep apnea  ? Liver disease  ? Certain lung diseases  ? Certain cancers You might begin your treatment with your primary healthcare provider. If you need additional help, you may want to see a bariatric healthcare provider. He or she may have new ideas or approaches for weight loss that can help you. Some bariatric healthcare providers give general medical care in addition to treating obesity. In this case, you may choose to make your bariatric healthcare provider your primary healthcare provider.  What can I expect at my first visit?  During your initial visit, your bariatric healthcare provider may:  ? Take a medical history. This includes your history of nutrition, exercise, and weight loss.  ? Do a physical exam, including BMI, waist circumference, and blood pressure  ? Look at your health  problems related to obesity  ? Look for other medical problems that might cause weight gain  ? Look at how ready you are to start an exercise program  ? Find out if you need tests  ? Help you make realistic weight loss goals  ? Give you a nutrition plan  ? Tell you to keep a food diary  ? Find out if you need a weight-loss medicine  Your bariatric healthcare provider should also give you information about:  ? Healthy eating habits  ? Healthy exercise habits  ? How to change health behaviors  ? How mental health affects obesity  ? Complications of obesity  ? Benefits and risks of medicines  Your healthcare provider will create a treatment plan for you based on your medical needs and preferences.  At each follow-up visit, your healthcare provider will check your progress. He or she will make changes to your treatment as needed. If you aren?t losing enough weight, your healthcare provider will suggest that you make other changes. As you lose weight and your health gets better, your healthcare provider might change some of your medicines. Your bariatric healthcare provider will also talk with you about your changing needs. For example, if you try other treatments and your weight loss stops, your healthcare provider might talk with you about weight-loss surgery.  What tests might my healthcare provider order?  Your bariatric healthcare provider may order a variety of tests to check factors related to your obesity, such as:  ? Tests for diabetes, such as fasting blood glucose  ? Lipid and cholesterol levels  ? Thyroid-stimulating hormone levels  ? Liver blood tests  ? Kidney function blood tests  ? Vitamin D levels  ? Electrocardiogram to assess heart rhythm  ? Exercise testing to see how well your heart works during exercise  ? Resting metabolic rate to see how many calories you burn at rest  Depending on your medical needs, your healthcare provider might give you other tests.  How can I find a healthcare provider who specializes in obesity?  Talk with your primary healthcare provider. He or she may be able to refer you to a bariatric healthcare provider. [employer], formerly known as [employer], has an Environmental manager. Visit [email or web address] to search for a healthcare provider in your area.  StayWell last reviewed this educational content on 06/23/2016  ? 2000-2019 [employer], [place]. [address] [place], [place] [address]. All rights reserved. This information is not intended as a substitute for professional medical care. Always follow your healthcare professional's instructions.        Bariatric Surgery: Possible Risks and Complications Deciding on bariatric surgery can be difficult. This is major surgery. If you qualify for bariatric surgery, you need to think about the possible risks and complications of having this surgery. Compare these with the risks and complications of not having the surgery. Make sure you know what to expect after surgery, too. You need to be willing to change your lifestyle for the rest of your life. And your body may change greatly in the years after surgery.   Possible risks and complications  As with any surgery, bariatric surgery has certain risks. The risks and complications will vary according to the type of bariatric surgery you have. Make sure you discuss your risks for surgery with your surgeon. These can include:   ? Infection, including in the stomach, the incisions, in the urinary tract  or the lungs, as well as other locations   ? Leaks, blockage at a site where tissue is sewn or stapled together (anastomosis), or bleeding. This will need more procedures or even another major operation to repair.  ? Breathing problems, such as pneumonia or collapsed lung, which may need mechanical ventilation  ? Acid reflux, ulcers, or esophagitis   ? Dumping syndrome-diarrhea, cramps and other gastrointestinal symptoms  ? Kidney failure  ? Internal hernia  ? Small bowel blockage  ? Depressed mood or other psychological issues   ? Blood clot(s) in the legs that may travel to the lungs or heart  ? Injury to the spleen, sometimes needing removal of the spleen   ? Recurrent vomiting that needs a procedure to stop the problem  ? Development of a hernia at one of the incision sites (including internally)   ? Problems from anesthesia  ? Other rare but severe complications   ? Death   Other concerns  You still may be concerned about the following after surgery: ? After surgery, your body may not absorb all the nutrients it needs, making malnutrition, anemia, or vitamin and mineral deficiency more likely. Vitamin and mineral supplements are needed to prevent this.  ? Dehydration is more likely after surgery. You must take care to drink enough liquids each day.  ? Gallstones may happen and may need more surgery.   ? You may also fail to meet your weight loss goals, or have a weight gain after initial weight loss.   ? Temporary hair loss is a common side effect of this surgery.  ? Loose folds of skin are common when a large amount of weight is lost. Extra skin can be surgically removed when your weight has stabilized, but this may not be fully covered by your insurance.   To learn more   ? American Society for Metabolic and Bariatric Surgerywww.asmbs.org  ? National Heart, Lung, and Blood Institute Obesity Education Initiative http://sutton.net/    StayWell last reviewed this educational content on 06/17/2014  ? 2000-2019 [employer], [place]. [address] [place], [place] [address]. All rights reserved. This information is not intended as a substitute for professional medical care. Always follow your healthcare professional's instructions.        Working with an Obesity Teacher, English as a foreign language)   Obesity is a complex problem. A general healthcare provider can offer help with weight loss. But a bariatric healthcare provider has more training in how to treat obesity. This type of provider is also called a bariatrician. They have had extra training in weight and health. Many of them have also had training to do surgery that aids in weight loss. This type of provider is called a Teacher, English as a foreign language.   What is obesity? Obesity is when body fat is above a certain level. Body mass index (BMI) is a way to measure obesity. BMI is a measure of body fat based on height and weight. A BMI of 25 to 30 is overweight. A BMI over 30 is obese. Your healthcare provider can calculate your BMI for you. You can also use an online BMI calculator at the [employer] at GolfingPosters.tn.  Why see a bariatric healthcare provider?  If you are obese, it?s important for you to get treatment. Obesity can lead to serious health problems, such as:  ? Diabetes  ? Arthritis  ? High blood pressure  ? Heart disease  ? Stroke  ? Sleep apnea  ?  Liver disease  ? Certain lung diseases  ? Certain cancers  You may begin your treatment with your primary healthcare provider. But if you need more help, you may want to see a bariatric healthcare provider. He or she may have new ideas or weight loss methods that can help you.   What to expect at your first visit  At your first visit, your bariatric healthcare provider may:  ? Ask about your health history. This includes your history of eating habits, exercise, and weight loss.  ? Give you a physical exam.  This includes BMI, waist circumference, and blood pressure.  He or she may order some tests. These are to check health factors linked to obesity. They also look for health problems that can cause weight gain. You may have tests such as:  ? Blood sugar levels, to check for diabetes  ? Lipid and cholesterol levels  ? Thyroid-stimulating hormone levels  ? Liver blood tests  ? Kidney function blood tests  ? Vitamin D levels  ? Electrocardiogram, to look at your heart rhythm  ? Exercise testing, to see how well your heart works during exercise  ? Resting metabolic rate, to look at how many calories you burn at rest  Creating a treatment plan  Your healthcare provider will create a treatment plan for you. The plan is based on your health needs and preferences. Your provider will: ? Find out how ready you are to begin an exercise program  ? Help you make realistic weight-loss goals  ? Give you a nutrition plan  ? Tell you to keep a food diary  ? Talk with you about a weight-loss medicine, if needed  He or she will give you information about:  ? Healthy eating habits  ? Healthy exercise habits  ? How to change health behaviors  ? How mental health affects obesity  ? The complications of obesity  ? The benefits and risks of medicines  At each follow-up visit, your healthcare provider will check your progress. He or she will make changes to your plan as needed. As you lose weight and your health improves, your provider might change some of your medicines. If your weight loss stops or you regain weight, he or she may talk with you about weight-loss surgery.  Finding a bariatric healthcare provider  Talk first with your primary healthcare provider. He or she may be able to refer you to a bariatric healthcare provider. You can also go to the Obesity Medicine Association website at ToledoInfo.fr. They have an Editor, commissioning of providers. You can search for ones in your area.  StayWell last reviewed this educational content on   ? 2000-2019 [employer], [place]. [address] [place], [place] [address]. All rights reserved. This information is not intended as a substitute for professional medical care. Always follow your healthcare professional's instructions.      For up to date information on the COVID-19 virus, visit the [facility] website. BoogieMedia.com.au  ? General supportive care during cold and flu season and infection prevention reminders:    o Wash hands often with soap and water for at least 20 seconds   o Cover your mouth and nose   o Social distancing: try to maintain 6 feet between you and other people   o Stay home if sick and symptoms mild or manageable?  ? If you must be around people wear a mask ? If you are having symptoms of a lower respiratory infection (cough, shortness  of breath) and/or fever AND either traveled in last 30 days (internationally or to region of exposure) OR known exposure to patient with COVID19:     o Call your primary care provider for questions or health needs.   ? Tell your doctor about your recent travel and your symptoms     o In a medical emergency, call 911 or go to the nearest emergency room.

## 2019-04-28 DIAGNOSIS — Z6832 Body mass index (BMI) 32.0-32.9, adult: Secondary | ICD-10-CM

## 2019-05-04 ENCOUNTER — Ambulatory Visit: Admit: 2019-05-04 | Discharge: 2019-05-04 | Payer: Private Health Insurance - Indemnity | Primary: Family

## 2019-05-04 ENCOUNTER — Encounter: Admit: 2019-05-04 | Discharge: 2019-05-04 | Payer: Private Health Insurance - Indemnity | Primary: Family

## 2019-05-04 DIAGNOSIS — K219 Gastro-esophageal reflux disease without esophagitis: Secondary | ICD-10-CM

## 2019-05-04 DIAGNOSIS — G4700 Insomnia, unspecified: Secondary | ICD-10-CM

## 2019-05-04 DIAGNOSIS — M21621 Bunionette of right foot: Secondary | ICD-10-CM

## 2019-05-04 DIAGNOSIS — J302 Other seasonal allergic rhinitis: Secondary | ICD-10-CM

## 2019-05-04 DIAGNOSIS — C50919 Malignant neoplasm of unspecified site of unspecified female breast: Secondary | ICD-10-CM

## 2019-05-04 DIAGNOSIS — M1990 Unspecified osteoarthritis, unspecified site: Secondary | ICD-10-CM

## 2019-05-04 DIAGNOSIS — M84374D Stress fracture, right foot, subsequent encounter for fracture with routine healing: Secondary | ICD-10-CM

## 2019-05-04 NOTE — Patient Instructions
To schedule your imaging - please call radiology at [phone]    Please do not hesitate to contact my office with any questions.    Dr. Bryan Vopat & Dwaine Pringle PA-C - Orthopedic Surgeon, Sports Medicine  [facility] - Phone [phone] - Fax [phone]   [address], [address] - [place], [place] [address]    Kara Schuessler BSN, RN - Clinical Nurse Coordinator  Casey Conover BSN, RN - Clinical Nurse Coordinator  Megan Burki MS, ATC, LAT - Clinical Athletic Trainer    Thank you for supporting our practice! Your feedback helps us deliver the highest quality of care.  Review us at: [email or web address]

## 2019-05-04 NOTE — Progress Notes
SUBJECTIVE:  Patricia Montes presents to the office today for a f/u visit regarding right foot pain due to bunionette and 4th, 5th MT stress reaction.  She was in the boot for 6 weeks.  She felt better in the boot but her symptoms return without it.  She is frustrated with her lack of activity due to discomfort. Her Vit D level was 29.6.  This was corrected with 50000 IU Vit D.  She has been on 5000 IU of D3 daily since completing the Rx dose.  She has now completed a provider led 6 week conservative treatment course.  She is currently being treated for breast cancer. Plain films of the Right foot indicates :  1. A fracture or dislocation is not identified.   2. The ankle mortise is well-maintained. The talar dome appears   unremarkable.   3. Joint spaces and bony alignment appear unremarkable. Incidental noted   is made of fusion fifth DIP joint.     OBJECTIVE:  PE:  Right foot was evaluated. Skin intact, moderate swelling noted over right foot bunionette, Tender to palpation over bunionette and midshaft 4th and 5th MT.  Full AROM of all toes, great toe MS 5/5, Foot structure Normal, neurovascular intact.        ASSESSMENT:    ICD-9-CM ICD-10-CM    1. Bunionette of right foot  727.1 M21.621    2. Stress fracture of right foot with routine healing  V54.89 M84.374D MRI LOWER EXTREM WO CONT RIGHT         PLAN:  She has exhausted conservative measures.  Due to her continued pain over the 4th and 5th MT and her history of breast cancer, will proceed with a right foot MRI.  Ice and elevation  If pain increases, go back into the boot.  F/u with Dr Alba Cory after the MRI is completed.

## 2019-05-13 ENCOUNTER — Ambulatory Visit: Admit: 2019-05-13 | Discharge: 2019-05-14 | Payer: Private Health Insurance - Indemnity | Primary: Family

## 2019-05-13 ENCOUNTER — Encounter: Admit: 2019-05-13 | Discharge: 2019-05-13 | Payer: Private Health Insurance - Indemnity | Primary: Family

## 2019-05-21 ENCOUNTER — Encounter: Admit: 2019-05-21 | Discharge: 2019-05-21 | Payer: Private Health Insurance - Indemnity | Primary: Family

## 2019-05-21 ENCOUNTER — Ambulatory Visit: Admit: 2019-05-21 | Discharge: 2019-05-21 | Payer: Private Health Insurance - Indemnity | Primary: Family

## 2019-05-21 DIAGNOSIS — M84374D Stress fracture, right foot, subsequent encounter for fracture with routine healing: Secondary | ICD-10-CM

## 2019-05-24 ENCOUNTER — Encounter: Admit: 2019-05-24 | Discharge: 2019-05-24 | Payer: Private Health Insurance - Indemnity | Primary: Family

## 2019-05-26 ENCOUNTER — Ambulatory Visit: Admit: 2019-05-26 | Discharge: 2019-05-27 | Payer: Private Health Insurance - Indemnity | Primary: Family

## 2019-05-26 ENCOUNTER — Encounter: Admit: 2019-05-26 | Discharge: 2019-05-26 | Payer: Private Health Insurance - Indemnity | Primary: Family

## 2019-05-26 DIAGNOSIS — Z4889 Encounter for other specified surgical aftercare: Secondary | ICD-10-CM

## 2019-05-26 NOTE — Progress Notes
Pt here for fill in good spirits. Left chest cleansed and prepped with alcohol and chloraprep. With use of sterile technique, left TE filled with 154ml NS. Pt tolerated well. New total=366ml. To follow up as scheduled, sooner if an concerns. Tilda Burrow, RN

## 2019-05-31 ENCOUNTER — Ambulatory Visit: Admit: 2019-05-31 | Discharge: 2019-05-31 | Payer: Private Health Insurance - Indemnity | Primary: Family

## 2019-05-31 ENCOUNTER — Encounter: Admit: 2019-05-31 | Discharge: 2019-05-31 | Payer: Private Health Insurance - Indemnity | Primary: Family

## 2019-05-31 DIAGNOSIS — M79671 Pain in right foot: Secondary | ICD-10-CM

## 2019-05-31 DIAGNOSIS — C50919 Malignant neoplasm of unspecified site of unspecified female breast: Secondary | ICD-10-CM

## 2019-05-31 DIAGNOSIS — J302 Other seasonal allergic rhinitis: Secondary | ICD-10-CM

## 2019-05-31 DIAGNOSIS — K219 Gastro-esophageal reflux disease without esophagitis: Secondary | ICD-10-CM

## 2019-05-31 DIAGNOSIS — M1990 Unspecified osteoarthritis, unspecified site: Secondary | ICD-10-CM

## 2019-05-31 DIAGNOSIS — G4700 Insomnia, unspecified: Secondary | ICD-10-CM

## 2019-05-31 DIAGNOSIS — M84374D Stress fracture, right foot, subsequent encounter for fracture with routine healing: Secondary | ICD-10-CM

## 2019-05-31 NOTE — Patient Instructions
Please do not hesitate to contact my office with any questions.    Dr. Bryan Vopat & Stephanie Caldwell PA-C - Orthopedic Surgeon, Sports Medicine  [facility] - Phone [phone] - Fax [phone]   [address], [address] - [place], [place] [address]    Margy Sumler BSN, RN - Clinical Nurse Coordinator  Casey Conover BSN, RN - Clinical Nurse Coordinator  Megan Burki MS, ATC, LAT - Clinical Athletic Trainer    Thank you for supporting our practice! Your feedback helps us deliver the highest quality of care.  Review us at: [email or web address]

## 2019-06-11 ENCOUNTER — Encounter: Admit: 2019-06-11 | Discharge: 2019-06-11 | Payer: Private Health Insurance - Indemnity | Primary: Family

## 2019-06-11 ENCOUNTER — Ambulatory Visit: Admit: 2019-06-11 | Discharge: 2019-06-11 | Payer: Private Health Insurance - Indemnity | Primary: Family

## 2019-06-11 DIAGNOSIS — Z4889 Encounter for other specified surgical aftercare: Secondary | ICD-10-CM

## 2019-06-11 NOTE — Progress Notes
Pt here for fill in good spirits. Left breast cleansed and prepped with alcohol and chloraprep, with use of sterile technique, left TE filled with 21ml NS. New total=356ml. To follow up as discussed, sooner if any concerns. Tilda Burrow, RN

## 2019-07-02 ENCOUNTER — Encounter: Admit: 2019-07-02 | Discharge: 2019-07-02 | Payer: Private Health Insurance - Indemnity | Primary: Family

## 2019-07-09 ENCOUNTER — Encounter: Admit: 2019-07-09 | Discharge: 2019-07-09 | Payer: Private Health Insurance - Indemnity | Primary: Family

## 2019-07-12 NOTE — Progress Notes
Date of Service: 07/12/2019                    History of Present Illness  Patricia Montes is a 54 y.o. female.  12/09/2018 LEFT SKIN SPARING MASTECTOMY (Left)  IDENTIFICATION SENTINEL LYMPH NODE (Left)  INJECTION RADIOACTIVE TRACER FOR SENTINEL NODE IDENTIFICATION (Left)  LEFT SENTINEL LYMPH NODE BIOPSY (Left)  RECONSTRUCTION BREAST WITH SIENTRA LPP-FH15S 600-720 FILLED TO 350 CC TISSUE EXPANDER- IMMEDIATE (Left)  IMPLANTATION ALLODERM EXTRA LARGE PERFORATED CONTOUR BIOLOGIC IMPLANT FOR SOFT TISSUE REINFORCEMENT (Left)  INTRAVENOUS INJECTION AGENT TO TEST VASCULAR FLOW IN FLAP/ GRAFT (Left)  Radiation from 01/20/19 - 02/25/19  Post radiation now at 380 cc  Likes being smaller  She wants to lose some weight  Aiming for surgery     Review of Systems   Constitutional: Negative.    HENT: Negative.    Eyes: Negative.    Respiratory: Negative.    Cardiovascular: Negative.    Gastrointestinal: Negative.    Endocrine: Negative.    Genitourinary: Negative.    Musculoskeletal: Negative.    Skin: Negative.    Allergic/Immunologic: Negative.    Neurological: Negative.    Hematological: Negative.    Psychiatric/Behavioral: Negative.        Medical History:   Diagnosis Date   ? Breast cancer (HCC) 06/15/2018    left IDC   ? GERD (gastroesophageal reflux disease)    ? Insomnia    ? Osteoarthritis    ? Seasonal allergies         Surgical History:   Procedure Laterality Date   ? CLAVICLE SURGERY Left 1991   ? HX BREAST REDUCTION Bilateral 2002    with abdominoplasty   ? HX HYSTERECTOMY  2012   ? PLACEMENT OF PORT-A-CATH 8 FRENCH SINGLE-LUMEN POWERPORT Right 07/15/2018    Performed by Dellia Cloud, DO at [facility]   ? FLUOROSCOPIC GUIDANCE CENTRAL VENOUS ACCESS DEVICE PLACEMENT Right 07/15/2018    Performed by Dellia Cloud, DO at [facility]   ? TUNNELED VENOUS PORT PLACEMENT Left 07/22/2018   ? LEFT SKIN SPARING MASTECTOMY Left 12/09/2018    Performed by Neldon Mc, DO at [facility]   ? IDENTIFICATION SENTINEL LYMPH NODE Left 12/09/2018    Performed by Neldon Mc, DO at [facility]   ? INJECTION RADIOACTIVE TRACER FOR SENTINEL NODE IDENTIFICATION Left 12/09/2018    Performed by Neldon Mc, DO at [facility]   ? LEFT SENTINEL LYMPH NODE BIOPSY Left 12/09/2018    Performed by Neldon Mc, DO at [facility]   ? RECONSTRUCTION BREAST WITH SIENTRA LPP-FH15S 600-720 FILLED TO 350 CC TISSUE EXPANDER- IMMEDIATE Left 12/09/2018    Performed by Corliss Skains, MD at [facility]   ? IMPLANTATION ALLODERM EXTRA LARGE PERFORATED CONTOUR BIOLOGIC IMPLANT FOR SOFT TISSUE REINFORCEMENT Left 12/09/2018    Performed by Corliss Skains, MD at [facility]   ? INTRAVENOUS INJECTION AGENT TO TEST VASCULAR FLOW IN FLAP/ GRAFT Left 12/09/2018    Performed by Corliss Skains, MD at [facility]        Family History   Problem Relation Age of Onset   ? Unknown to Patient Mother    ? Heart Disease Father    ? High Cholesterol Father    ? Seizures Brother    ? Arthritis-rheumatoid Maternal Aunt    ? Cancer Maternal Uncle         non Hodgkins   ?  Arthritis-rheumatoid Maternal Grandmother    ? Cancer-Breast Other 82        mat great gma   ? Heart Disease Other    ? Arthritis-rheumatoid Other    ? High Cholesterol Other         Social History     Socioeconomic History   ? Marital status: Married     Spouse name: Not on file   ? Number of children: Not on file   ? Years of education: Not on file   ? Highest education level: Not on file   Occupational History   ? Not on file   Tobacco Use   ? Smoking status: Passive Smoke Exposure - Never Smoker   ? Smokeless tobacco: Never Used   Substance and Sexual Activity   ? Alcohol use: Not Currently     Frequency: Never   ? Drug use: Not Currently   ? Sexual activity: Not on file   Other Topics Concern   ? Not on file   Social History Narrative   ? Not on file          Objective:         ? acetaminophen (TYLENOL) 325 mg tablet Take two tablets by mouth every 6 hours. Take scheduled for 3 days after surgery, then as needed. Do not exceed 4,000mg  in a 24 hour period.   ? ADO-trastuzumab EMTANSINE (KADCYLA) 100 mg/5 mL injection Indications: Q 3wks   ? calcium carbonate (CALCIUM 500 PO) Calcium 500   daily   ? cetirizine (ZYRTEC) 10 mg tablet Take 10 mg by mouth every morning.   ? cyclobenzaprine (FLEXERIL) 10 mg tablet Take 10 mg by mouth every 8 hours.   ? DIPH/LIDO/ANTACID 1:1:1 (COMPOUND) Swish and Swallow 10 mL by mouth as directed three times daily as needed for Mouth Pain.   ? diphenhydrAMINE (BENADRYL ALLERGY) 25 mg tablet Take 25 mg by mouth every 6 hours as needed.   ? fluticasone propionate (FLONASE) 50 mcg/actuation nasal spray, suspension Apply 1 spray into nose as directed every 24 hours.   ? fluticasone propionate (FLOVENT DISKUS) 50 mcg/actuation inhaler every 24 hours.   ? gabapentin 300 mg Tb24 Take 1 tablet by mouth twice daily.   ? guaiFENesin LA (MUCINEX) 600 mg tablet Take 600 mg by mouth twice daily as needed.   ? letrozole (FEMARA) 2.5 mg tablet Take 2.5 mg by mouth daily.   ? liraglutide (weight loss) (SAXENDA) 3 mg/0.5 mL (18 mg/3 mL) injection PEN every 24 hours.   ? lisinopriL (ZESTRIL) 5 mg tablet TK 1 TABLET PO DAILY   ? magnesium citrate oral solution Take 148 mL by mouth once.   ? multivit-min-iron-FA-lutein (CENTRUM SILVER WOMEN) 8 mg iron-400 mcg-300 mcg tab Take 1 tablet by mouth daily.   ? ondansetron (ZOFRAN) 8 mg tablet Indications: Q 3wks  w/ Kydcyla infusion   ? pantoprazole DR (PROTONIX) 20 mg tablet Take 20 mg by mouth at bedtime daily.   ? senna/docusate (SENOKOT-S) 8.6/50 mg tablet Take one tablet by mouth twice daily. To prevent constipation while taking pain medication.   ? silver sulfADIAZINE (THERMAZINE) 1 % topical cream Apply to affected area three times a day as needed.   ? traZODone (DESYREL) 50 mg tablet Take 50 mg by mouth at bedtime as needed.     Vitals:    07/12/19 0948   BP: 115/64   BP Source: Arm, Left Upper   Patient Position: Sitting   Pulse: 84  Temp: 36.3 ?C (97.3 ?F)   TempSrc: Temporal   SpO2: 100%   Weight: 86.7 kg (191 lb 3.2 oz)   Height: 165.1 cm (65)   PainSc: Zero     Body mass index is 31.82 kg/m?Marland Kitchen  Body surface area is 1.99 meters squared.          Physical Exam  Left well healed  Some radiation change  Right stable    Assessment and Plan:  Will need to book left breast tissue expander, capsulotomy, placement implant and right balancing mastopexy  2 hr GA DS

## 2019-08-02 ENCOUNTER — Encounter: Admit: 2019-08-02 | Discharge: 2019-08-02 | Payer: Private Health Insurance - Indemnity | Primary: Family

## 2019-08-04 ENCOUNTER — Encounter: Admit: 2019-08-04 | Discharge: 2019-08-04 | Payer: Private Health Insurance - Indemnity | Primary: Family

## 2019-09-19 ENCOUNTER — Encounter: Admit: 2019-09-19 | Discharge: 2019-09-19 | Payer: Private Health Insurance - Indemnity | Primary: Family

## 2019-09-23 ENCOUNTER — Ambulatory Visit: Admit: 2019-09-23 | Discharge: 2019-09-23 | Payer: Private Health Insurance - Indemnity | Primary: Family

## 2019-09-23 ENCOUNTER — Encounter: Admit: 2019-09-23 | Discharge: 2019-09-23 | Payer: Private Health Insurance - Indemnity | Primary: Family

## 2019-10-08 ENCOUNTER — Ambulatory Visit: Admit: 2019-10-08 | Discharge: 2019-10-08 | Payer: Private Health Insurance - Indemnity | Primary: Family

## 2019-10-11 ENCOUNTER — Ambulatory Visit: Admit: 2019-10-11 | Discharge: 2019-10-11 | Payer: Private Health Insurance - Indemnity | Primary: Family

## 2019-10-11 ENCOUNTER — Encounter: Admit: 2019-10-11 | Discharge: 2019-10-11 | Payer: Private Health Insurance - Indemnity | Primary: Family

## 2019-10-11 DIAGNOSIS — C50919 Malignant neoplasm of unspecified site of unspecified female breast: Secondary | ICD-10-CM

## 2019-10-11 DIAGNOSIS — Z421 Encounter for breast reconstruction following mastectomy: Secondary | ICD-10-CM

## 2019-10-11 DIAGNOSIS — K219 Gastro-esophageal reflux disease without esophagitis: Secondary | ICD-10-CM

## 2019-10-11 DIAGNOSIS — G4700 Insomnia, unspecified: Secondary | ICD-10-CM

## 2019-10-11 DIAGNOSIS — T8544XA Capsular contracture of breast implant, initial encounter: Secondary | ICD-10-CM

## 2019-10-11 DIAGNOSIS — T66XXXA Radiation sickness, unspecified, initial encounter: Secondary | ICD-10-CM

## 2019-10-11 DIAGNOSIS — J302 Other seasonal allergic rhinitis: Secondary | ICD-10-CM

## 2019-10-11 DIAGNOSIS — M1990 Unspecified osteoarthritis, unspecified site: Secondary | ICD-10-CM

## 2019-10-11 NOTE — Progress Notes
Date of Service: 10/11/2019    Subjective:             Patricia Montes is a 54 y.o. female.    History of Present Illness  [place] 12/09/2018?LEFT SKIN SPARING MASTECTOMY (Left)  IDENTIFICATION SENTINEL LYMPH NODE (Left)  INJECTION RADIOACTIVE TRACER FOR SENTINEL NODE IDENTIFICATION (Left)  LEFT SENTINEL LYMPH NODE BIOPSY (Left)  RECONSTRUCTION BREAST WITH SIENTRA LPP-FH15S 600-720 FILLED TO 350 CC TISSUE EXPANDER- IMMEDIATE (Left)  IMPLANTATION ALLODERM EXTRA LARGE PERFORATED CONTOUR BIOLOGIC IMPLANT FOR SOFT TISSUE REINFORCEMENT (Left)  INTRAVENOUS INJECTION AGENT TO TEST VASCULAR FLOW IN FLAP/ GRAFT (Left)  Radiation from 01/20/19?- 02/25/19  Post radiation now at 380 cc  Likes being smaller  Sore in left upper pole       Review of Systems   Constitutional: Negative.    HENT: Negative.    Eyes: Negative.    Respiratory: Negative.    Cardiovascular: Negative.    Gastrointestinal: Negative.    Endocrine: Negative.    Genitourinary: Negative.    Musculoskeletal: Negative.    Skin: Negative.    Allergic/Immunologic: Negative.    Neurological: Negative.    Hematological: Negative.    Psychiatric/Behavioral: Negative.          Objective:         ? acetaminophen (TYLENOL) 325 mg tablet Take two tablets by mouth every 6 hours. Take scheduled for 3 days after surgery, then as needed. Do not exceed 4,000mg  in a 24 hour period.   ? ADO-trastuzumab EMTANSINE (KADCYLA) 100 mg/5 mL injection Indications: Q 3wks   ? calcium carbonate (CALCIUM 500 PO) Calcium 500   daily   ? cetirizine (ZYRTEC) 10 mg tablet Take 10 mg by mouth every morning.   ? cyclobenzaprine (FLEXERIL) 10 mg tablet Take 10 mg by mouth every 8 hours.   ? DIPH/LIDO/ANTACID 1:1:1 (COMPOUND) Swish and Swallow 10 mL by mouth as directed three times daily as needed for Mouth Pain.   ? diphenhydrAMINE (BENADRYL ALLERGY) 25 mg tablet Take 25 mg by mouth every 6 hours as needed.   ? fluticasone propionate (FLONASE) 50 mcg/actuation nasal spray, suspension Apply 1 spray into nose as directed every 24 hours.   ? fluticasone propionate (FLOVENT DISKUS) 50 mcg/actuation inhaler every 24 hours.   ? gabapentin 300 mg Tb24 Take 1 tablet by mouth twice daily.   ? guaiFENesin LA (MUCINEX) 600 mg tablet Take 600 mg by mouth twice daily as needed.   ? letrozole (FEMARA) 2.5 mg tablet Take 2.5 mg by mouth daily.   ? liraglutide (weight loss) (SAXENDA) 3 mg/0.5 mL (18 mg/3 mL) injection PEN every 24 hours.   ? lisinopriL (ZESTRIL) 5 mg tablet TK 1 TABLET PO DAILY   ? magnesium citrate oral solution Take 148 mL by mouth once.   ? multivit-min-iron-FA-lutein (CENTRUM SILVER WOMEN) 8 mg iron-400 mcg-300 mcg tab Take 1 tablet by mouth daily.   ? ondansetron (ZOFRAN) 8 mg tablet Indications: Q 3wks  w/ Kydcyla infusion   ? pantoprazole DR (PROTONIX) 20 mg tablet Take 20 mg by mouth at bedtime daily.   ? senna/docusate (SENOKOT-S) 8.6/50 mg tablet Take one tablet by mouth twice daily. To prevent constipation while taking pain medication.   ? silver sulfADIAZINE (THERMAZINE) 1 % topical cream Apply to affected area three times a day as needed.   ? traZODone (DESYREL) 50 mg tablet Take 50 mg by mouth at bedtime as needed.     Vitals:    10/11/19 1355  BP: (!) 149/86   BP Source: Arm, Right Upper   Patient Position: Sitting   Pulse: 87   Temp: 36.5 ?C (97.7 ?F)   TempSrc: Temporal   SpO2: 100%   Weight: 85.3 kg (188 lb)   Height: 165.1 cm (65)   PainSc: Zero     Body mass index is 31.28 kg/m?Marland Kitchen     Physical Exam  Vitals and nursing note reviewed. Exam conducted with a chaperone present.   Constitutional:       Appearance: Normal appearance. She is obese.   Chest:      Breasts:         Right: Normal.        Comments: Right previous mastopexy with ptosis and deflation  Left radiation change  Tight lower pole  16 cm base of the breast  Left upper pole port-to be removed  Abdominal:      General: Abdomen is protuberant.      Hernia: No hernia is present.      Comments: Previous abdominoplasty  Mid and upper abdominal laxity   Neurological:      Mental Status: She is alert and oriented to person, place, and time.   Psychiatric:         Behavior: Behavior normal.              Assessment and Plan:  Discussed next stage of left breast tissue expander removal, capsulotomy, placement implant and right balancing mastopexy and fat grafting from abdomen to breasts  Sientra [id number] implants  2 hr GA DS  Aware of recovery and expected result  ?

## 2019-11-04 ENCOUNTER — Encounter: Admit: 2019-11-04 | Discharge: 2019-11-04 | Payer: Private Health Insurance - Indemnity | Primary: Family

## 2019-11-04 ENCOUNTER — Ambulatory Visit: Admit: 2019-11-04 | Discharge: 2019-11-04 | Payer: Private Health Insurance - Indemnity | Primary: Family

## 2019-11-04 DIAGNOSIS — G4700 Insomnia, unspecified: Secondary | ICD-10-CM

## 2019-11-04 DIAGNOSIS — K219 Gastro-esophageal reflux disease without esophagitis: Secondary | ICD-10-CM

## 2019-11-04 DIAGNOSIS — C50919 Malignant neoplasm of unspecified site of unspecified female breast: Secondary | ICD-10-CM

## 2019-11-04 DIAGNOSIS — M1990 Unspecified osteoarthritis, unspecified site: Secondary | ICD-10-CM

## 2019-11-04 DIAGNOSIS — J302 Other seasonal allergic rhinitis: Secondary | ICD-10-CM

## 2019-11-23 ENCOUNTER — Encounter: Admit: 2019-11-23 | Discharge: 2019-11-23 | Payer: Private Health Insurance - Indemnity | Primary: Family

## 2019-11-25 ENCOUNTER — Encounter: Admit: 2019-11-25 | Discharge: 2019-11-25 | Payer: Private Health Insurance - Indemnity | Primary: Family

## 2019-11-25 DIAGNOSIS — G629 Polyneuropathy, unspecified: Secondary | ICD-10-CM

## 2019-11-25 DIAGNOSIS — K219 Gastro-esophageal reflux disease without esophagitis: Secondary | ICD-10-CM

## 2019-11-25 DIAGNOSIS — C50112 Malignant neoplasm of central portion of left female breast: Secondary | ICD-10-CM

## 2019-11-25 DIAGNOSIS — Z9189 Other specified personal risk factors, not elsewhere classified: Secondary | ICD-10-CM

## 2019-11-25 DIAGNOSIS — J302 Other seasonal allergic rhinitis: Secondary | ICD-10-CM

## 2019-11-25 DIAGNOSIS — C50919 Malignant neoplasm of unspecified site of unspecified female breast: Secondary | ICD-10-CM

## 2019-11-25 DIAGNOSIS — M1990 Unspecified osteoarthritis, unspecified site: Secondary | ICD-10-CM

## 2019-11-25 DIAGNOSIS — G4700 Insomnia, unspecified: Secondary | ICD-10-CM

## 2019-11-25 NOTE — Progress Notes
Name: Patricia Montes          MRN: [id number]      DOB: Dec 12, 1965      AGE: 54 y.o.   DATE OF SERVICE: 11/25/2019              Reason for Visit:  Heme/Onc Care      Patricia Montes is a 54 y.o. female.     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     HISTORY:  Patricia Montes is a caucasian female who presented to the [facility] on 06/30/2018 at age 59 for evaluation of left breast cancer. Patricia Montes had no complaints prior to her outside screening mammogram in March 2020. There was a new left breast mass in the central breast that was suspicious. This was suspicious on diagnostic imaging and biopsy was recommended. 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 [name] underwent left skin sparing mastectomy/SLNB/TE on 12/09/18. She finished radiation with Dr. Haskel Khan on 02/25/19.    PATHOLOGY:  Tumor:    Size/Extent of Tumor Bed: 2.5 x 1.4 x 1.1 cm   Size/Extent of Residual Invasive Tumor: 0.7 mm   Overall Residual Cellularity of the Tumor Bed: Less than 5%  Margins Free From Tumor:  Yes  ER:  positive   PR:  positive    Her 2:  unknown  Grade:  N/A - too small to run  Lymph Nodes:  1/1  LVSI:  yes  Extranodal extension:  no      BREAST IMAGING:  Mammogram:    -- Bilateral screening mammogram 05/26/18 ([place]) revealed scattered fibroglandular densities. On MLO view in the left breast at 12:00 there was a 6 mm asymmetric density lying roughly 3.5 cm FTN. This finding was not as conspicuous on CC view but still appeared to be present on tomographic images. The development of this density since the prior exam did make it worrisome for malignancy. Diagnostic imaging recommended. The right breast was unremarkable.  -- Left diagnostic mammogram 06/15/18 [facility]) revealed a persistent spiculated density in the upper left breast approximately 2-3 cm FTN. This was concerning for breast neoplasm. Further evaluation with ultrasound recommended.  -- Left diagnostic mammogram 06/30/18 ([place]) revealed fatty breast tissue. There was a 3.2 cm irregular spiculated mass in the 11:30 to 12:00  anterior right breast. This mass had associated calcifications and overlying skin and nipple retraction. The calcifications and mass measured up to 4.6 cm. Calcifications extended to approximately 9 mm posterior to the left nipple. Scattered calcifications in the posterior left breast on the spot magnification cc view are within the far inferior left breast on outside images and associated with postreduction change in this patient.    Ultrasound:    -- Left breast  ultrasound 06/15/18 ([place]) revealed an irregular hypoechoic mass like region at 12:00, 2 cm FTN. This measured 1.9 x 0.9 x 1.5 cm. This did show internal vascularity. No other breast lesions. The largest node in the axilla measured 1.3 x 0.8 x 1.2 cm and did have a fatty hilum.  -- Targeted left breast ultrasound 06/30/18 ([place]) revealed at 12:00, 2 cm from the nipple, demonstrated a 1.8 x 0.9 x 2.3 cm mass which extended to 0.4 cm from the left nipple. This corresponds to biopsy-proven malignancy. The tissue marker clip was visible at the margin of the mass. No suspicious left axillary lymph nodes were seen. 4 morphologically normal left axillary lymph nodes were identified.    REPRODUCTIVE HEALTH:  Age at first Menarche: 26   Age at First Live Birth:  51  Age at Menopause:  Postmenopausal s/p hysterectomy  Gravida:  4  Para: 3  Breastfeeding:  Yes    PROCEDURE:   1. Bilateral breast reduction  2. Left skin sparing mastectomy/SLNB/TE, 12/09/18  PERTINENT PMH:  Seasonal allergies, passive smoke exposure  FAMILY HISTORY:  Maternal great grandmother with breast cancer at 53  PHYSICAL EXAM on PRESENTATION:  Left - 4 cm palpable mass at 12:00, nipple retraction that is exacerbated in the seated position with arms in flexion. Right - No palpable breast masses. No skin, nipple, or areolar change. No supraclavicular or axillary adenopathy.  MEDICAL ONCOLOGY:  Dr. Lysle Morales NEOADJUVANT THERAPY: TCH finished 10/20/18, stopped after cycle 5 due to neuropathy PRESENT THERAPY: Kadcyla; Letrozole  REFERRED BY:  Dr. Nathanial Millman       Review of Systems    Constitutional: Negative for fever, chills, appetite change and fatigue.   HENT: Negative for hearing loss, congestion, rhinorrhea and tinnitus.    Eyes: Negative for pain, discharge and itching.   Respiratory: Negative for cough, chest tightness and shortness of breath.    Cardiovascular: Negative for chest pain and palpitations.   Gastrointestinal: Negative for abdominal distention, pain, nausea, vomiting, and diarrhea.   Genitourinary: Negative for frequency, vaginal bleeding, difficulty urinating and pelvic pain.   Musculoskeletal: Negative for myalgias, back pain, joint swelling and arthralgias.   Skin: Negative for rash.   Neurological: Negative for dizziness, weakness, light-headedness and headaches. neuropathy worsening in hands and toes  Hematological: Does not bruise/bleed easily.   Psychiatric/Behavioral: Negative for disturbed wake/sleep cycle. The patient is not nervous/anxious.    Allergies   Allergen Reactions   ? Seasonal Allergies RHINITIS and COUGH     Sinus drainage    ? Triple Antibiotic [Neomy-Bacit-Polymyx-Pramoxine] SEE COMMENTS     Petrolium base products        Medical History:   Diagnosis Date   ? Breast cancer (HCC) 06/15/2018    left IDC   ? GERD (gastroesophageal reflux disease)    ? Insomnia    ? Osteoarthritis    ? Seasonal allergies      Surgical History:   Procedure Laterality Date   ? CLAVICLE SURGERY Left 1991   ? HX BREAST REDUCTION Bilateral 2002    with abdominoplasty   ? HX HYSTERECTOMY  2012   ? PLACEMENT OF PORT-A-CATH 8 FRENCH SINGLE-LUMEN POWERPORT Right 07/15/2018    Performed by Dellia Cloud, DO at [facility]   ? FLUOROSCOPIC GUIDANCE CENTRAL VENOUS ACCESS DEVICE PLACEMENT Right 07/15/2018    Performed by Dellia Cloud, DO at [facility]   ? TUNNELED VENOUS PORT PLACEMENT Left 07/22/2018   ? LEFT SKIN SPARING MASTECTOMY  Left 12/09/2018    Performed by Neldon Mc, DO at [facility]   ? IDENTIFICATION SENTINEL LYMPH NODE Left 12/09/2018    Performed by Neldon Mc, DO at [facility]   ? INJECTION RADIOACTIVE TRACER FOR SENTINEL NODE IDENTIFICATION Left 12/09/2018    Performed by Neldon Mc, DO at [facility]   ? LEFT SENTINEL LYMPH NODE BIOPSY Left 12/09/2018    Performed by Neldon Mc, DO at [facility]   ? RECONSTRUCTION BREAST WITH SIENTRA [id number] [id number] FILLED TO 350 CC TISSUE EXPANDER- IMMEDIATE Left 12/09/2018    Performed by Corliss Skains, MD at [facility]   ? IMPLANTATION ALLODERM EXTRA LARGE PERFORATED CONTOUR BIOLOGIC IMPLANT FOR SOFT TISSUE REINFORCEMENT Left 12/09/2018    Performed by Corliss Skains, MD at [facility]   ? INTRAVENOUS INJECTION AGENT TO TEST VASCULAR FLOW IN FLAP/ GRAFT Left 12/09/2018    Performed by Corliss Skains, MD at [facility]     Family History   Problem Relation Age of Onset   ? Unknown to Patient Mother    ? Heart Disease Father    ? High Cholesterol Father    ? Seizures Brother    ? Arthritis-rheumatoid Maternal Aunt    ? Cancer Maternal Uncle         non Hodgkins   ? Arthritis-rheumatoid Maternal Grandmother    ? Cancer-Breast Other 82        mat great gma   ? Heart Disease Other    ? Arthritis-rheumatoid Other    ? High Cholesterol Other      Social History     Socioeconomic History   ? Marital status: Married     Spouse name: Not on file   ? Number of children: Not on file   ? Years of education: Not on file   ? Highest education level: Not on file   Occupational History   ? Not on file   Tobacco Use   ? Smoking status: Passive Smoke Exposure - Never Smoker   ? Smokeless tobacco: Never Used   Substance and Sexual Activity   ? Alcohol use: Not Currently   ? Drug use: Not Currently   ? Sexual activity: Not on file   Other Topics Concern   ? Not on file   Social History Narrative   ? Not on file                   Objective:         ? acetaminophen (TYLENOL) 325 mg tablet Take two tablets by mouth every 6 hours. Take scheduled for 3 days after surgery, then as needed. Do not exceed 4,000mg  in a 24 hour period.   ? calcium carbonate (CALCIUM 500 PO) Calcium 500   daily   ? cetirizine (ZYRTEC) 10 mg tablet Take 10 mg by mouth every morning.   ? cyclobenzaprine (FLEXERIL) 10 mg tablet Take 10 mg by mouth every 8 hours. Prn   ? DIPH/LIDO/ANTACID 1:1:1 (COMPOUND) Swish and Swallow 10 mL by mouth as directed three times daily as needed for Mouth Pain.   ? fluticasone propionate (FLONASE) 50 mcg/actuation nasal spray, suspension Apply 1 spray into nose as directed every 24 hours.   ? fluticasone propionate (FLOVENT DISKUS) 50 mcg/actuation inhaler every 24 hours.   ? gabapentin 300 mg Tb24 Take 1 tablet by mouth twice daily.   ? guaiFENesin LA (MUCINEX) 600 mg tablet Take 600 mg by mouth  twice daily as needed.   ? letrozole (FEMARA) 2.5 mg tablet Take 2.5 mg by mouth daily.   ? liraglutide (weight loss) (SAXENDA) 3 mg/0.5 mL (18 mg/3 mL) injection PEN every 24 hours.   ? lisinopriL (ZESTRIL) 5 mg tablet TK 1 TABLET PO DAILY   ? magnesium citrate oral solution Take 148 mL by mouth once.   ? multivit-min-iron-FA-lutein (CENTRUM SILVER WOMEN) 8 mg iron-400 mcg-300 mcg tab Take 1 tablet by mouth daily.   ? pantoprazole DR (PROTONIX) 20 mg tablet Take 20 mg by mouth at bedtime daily.   ? traZODone (DESYREL) 50 mg tablet Take 50 mg by mouth at bedtime as needed.     Vitals:    11/25/19 1500   BP: 123/72   BP Source: Arm, Right Upper   Patient Position: Sitting   Pulse: 82   Temp: 37 ?C (98.6 ?F)   TempSrc: Temporal   SpO2: 100%   Weight: 83.2 kg (183 lb 6.4 oz)   Height: 165.1 cm (65)   PainSc: Zero     Body mass index is 30.52 kg/m?Marland Kitchen     Pain Score: Zero       Fatigue Scale: 0-None    Pain Addressed:  N/A    Patient Evaluated for a Clinical Trial: No treatment clinical trial available for this patient.     [place] Cooperative Oncology Group performance status is 1, Restricted in physically strenuous activity but ambulatory and able to carry out work of a light or sedentary nature, e.g., light house work, office work.     Physical Exam  Vitals reviewed.   Chest:         RIGHT BREAST EXAM:  Breast:  No palpable masses  Skin Erythema:  No  Attachment of Overlying Skin:  No  Peau d' orange:  No  Chest Wall Attachment:  No  Nipple Inversion:  No  Nipple Discharge: No    LEFT BREAST EXAM:  Breast: S/p mastectomy/SLNB/TE. Mild radiation skin changes. No palpable masses  Skin Erythema:  No  Attachment of Overlying Skin:  No  Peau d' orange:  No  Chest Wall Attachment: No  Nipple Inversion:  No  Nipple Discharge:  No    RIGHT NODAL BASIN EXAM:  Axillary:  negative  Infraclavicular:  negative  Supraclavicular:  negative    LEFT NODAL BASIN EXAM:  Axillary:  negative  Infraclavicular: negative  Supraclavicular:  negative      Constitutional: No acute distress.  HEENT:  Head: Normocephalic and atraumatic.  Eyes: No discharge. No scleral icterus.  Pulmonary/Chest: No respiratory distress.   Neurological: Alert and oriented to person, place and time. No cranial nerve deficit.  Skin: Warm and dry. No rash noted. No erythema. No pallor.  Psychiatric: Normal mood and affect. Behavior is normal. Judgement and thought content normal.            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 Abdelnour reports she has been having worsening neuropathy in her hands and feet, but especially her hands. She is on gabapentin for chemotherapy induced neuropathy, but this is worsening and moving proximally up her hands into her wrists. She has tried wearing a wrist brace the past few days, but has not seen significant relief. She is on letrozole which may be causing some  carpal tunnel like symptoms. She spoke with Dr. Lysle Morales who had her stop the letrozole for 2 weeks and yesterday was day 1. I will place a neurology consult for further evaluation if stopping the letrozole does not help. I would also recommend contacting her PCP as she could have a B12 deficiency which would cause neuropathy. She reports her bother has pernicious anemia and she could also have this. She is planning to contact them today. She had a right screening mammogram in March in [place] which was negative and will follow up there for mammograms. She finished PMRT with Dr. Haskel Khan on 02/25/19. She is scheduled for reconstruction with Dr. Loney Laurence in October. Ms. Pellissier had a BIS today for arm lymphedema surveillance and has no arm complaints. I will plan to see her back in 1 year with BIS. She was given ample time to ask questions all of which were answered to her satisfaction. She was encouraged to call with any interval questions or concerns.    1. Refer to neurology  2. Ask PCP to draw B12  3. Continue follow up with Dr. Lysle Morales  4. PCP to order right mammogram in March 2022  5. Continue follow up with Dr. Loney Laurence  6. RTC in 1 year with BIS    Guy Begin, PA-C

## 2019-11-25 NOTE — Progress Notes
Bioimpedance Spectroscopy performed. Advised patient that Normal result will be sent within 24 hours by mail or via Mychart (preferred). The patient will be contacted via phone by the lymphedema nurse with any abnormal results.

## 2019-11-25 NOTE — Progress Notes
Reviewed BIS testing results from today.  Results   Current: 1.7  Baseline: 3.5  Change from Baseline: -1.8  WNL less than 3 standard deviation increase from baseline.      Notified patient viaMyChart result was normal and to continue with routine follow up as scheduled.  Provided clinic contact information for any questions or concerns.

## 2019-12-02 ENCOUNTER — Encounter: Admit: 2019-12-02 | Discharge: 2019-12-02 | Payer: Private Health Insurance - Indemnity | Primary: Family

## 2019-12-02 NOTE — Telephone Encounter
LM to follow up on neuropathy.    Guy Begin, PA-C

## 2019-12-14 ENCOUNTER — Encounter: Admit: 2019-12-14 | Discharge: 2019-12-14 | Payer: Private Health Insurance - Indemnity | Primary: Family

## 2019-12-14 ENCOUNTER — Ambulatory Visit: Admit: 2019-12-14 | Discharge: 2019-12-14 | Payer: Private Health Insurance - Indemnity | Primary: Family

## 2019-12-14 DIAGNOSIS — C50112 Malignant neoplasm of central portion of left female breast: Secondary | ICD-10-CM

## 2019-12-14 DIAGNOSIS — Z421 Encounter for breast reconstruction following mastectomy: Secondary | ICD-10-CM

## 2019-12-14 DIAGNOSIS — T66XXXA Radiation sickness, unspecified, initial encounter: Secondary | ICD-10-CM

## 2019-12-14 DIAGNOSIS — Z20822 Encounter for screening laboratory testing for COVID-19 virus in asymptomatic patient: Secondary | ICD-10-CM

## 2019-12-17 ENCOUNTER — Encounter: Admit: 2019-12-17 | Discharge: 2019-12-17 | Payer: Private Health Insurance - Indemnity | Primary: Family

## 2019-12-17 DIAGNOSIS — K219 Gastro-esophageal reflux disease without esophagitis: Secondary | ICD-10-CM

## 2019-12-17 DIAGNOSIS — J302 Other seasonal allergic rhinitis: Secondary | ICD-10-CM

## 2019-12-17 DIAGNOSIS — G4700 Insomnia, unspecified: Secondary | ICD-10-CM

## 2019-12-17 DIAGNOSIS — C50919 Malignant neoplasm of unspecified site of unspecified female breast: Secondary | ICD-10-CM

## 2019-12-17 DIAGNOSIS — M1990 Unspecified osteoarthritis, unspecified site: Secondary | ICD-10-CM

## 2019-12-27 ENCOUNTER — Encounter: Admit: 2019-12-27 | Discharge: 2019-12-27 | Payer: Private Health Insurance - Indemnity | Primary: Family

## 2019-12-31 ENCOUNTER — Encounter: Admit: 2019-12-31 | Discharge: 2020-01-01 | Payer: Private Health Insurance - Indemnity | Primary: Family

## 2020-01-01 DIAGNOSIS — Z20822 Contact with and (suspected) exposure to covid-19: Secondary | ICD-10-CM

## 2020-01-01 LAB — COVID-19 (SARS-COV-2) PCR

## 2020-01-02 ENCOUNTER — Encounter: Admit: 2020-01-02 | Discharge: 2020-01-02 | Payer: Private Health Insurance - Indemnity | Primary: Family

## 2020-01-03 ENCOUNTER — Encounter: Admit: 2020-01-03 | Discharge: 2020-01-03 | Payer: Private Health Insurance - Indemnity | Primary: Family

## 2020-01-03 ENCOUNTER — Ambulatory Visit: Admit: 2020-01-03 | Discharge: 2020-01-03 | Payer: Private Health Insurance - Indemnity | Primary: Family

## 2020-01-03 DIAGNOSIS — M1990 Unspecified osteoarthritis, unspecified site: Secondary | ICD-10-CM

## 2020-01-03 DIAGNOSIS — C50919 Malignant neoplasm of unspecified site of unspecified female breast: Secondary | ICD-10-CM

## 2020-01-03 DIAGNOSIS — J302 Other seasonal allergic rhinitis: Secondary | ICD-10-CM

## 2020-01-03 DIAGNOSIS — G4700 Insomnia, unspecified: Secondary | ICD-10-CM

## 2020-01-03 DIAGNOSIS — K219 Gastro-esophageal reflux disease without esophagitis: Secondary | ICD-10-CM

## 2020-01-03 MED ORDER — MIDAZOLAM 1 MG/ML IJ SOLN
INTRAVENOUS | 0 refills | Status: DC
Start: 2020-01-03 — End: 2020-01-03
  Administered 2020-01-03: 14:00:00 2 mg via INTRAVENOUS

## 2020-01-03 MED ORDER — ARTIFICIAL TEARS SINGLE DOSE DROPS GROUP
OPHTHALMIC | 0 refills | Status: DC
Start: 2020-01-03 — End: 2020-01-03
  Administered 2020-01-03: 14:00:00 2 [drp] via OPHTHALMIC

## 2020-01-03 MED ORDER — LIDOCAINE (PF) 200 MG/10 ML (2 %) IJ SYRG
INTRAVENOUS | 0 refills | Status: DC
Start: 2020-01-03 — End: 2020-01-03
  Administered 2020-01-03: 14:00:00 60 mg via INTRAVENOUS

## 2020-01-03 MED ORDER — PROPOFOL INJ 10 MG/ML IV VIAL
INTRAVENOUS | 0 refills | Status: DC
Start: 2020-01-03 — End: 2020-01-03
  Administered 2020-01-03: 14:00:00 150 mg via INTRAVENOUS

## 2020-01-03 MED ORDER — FENTANYL CITRATE (PF) 50 MCG/ML IJ SOLN
INTRAVENOUS | 0 refills | Status: DC
Start: 2020-01-03 — End: 2020-01-03
  Administered 2020-01-03 (×4): 25 ug via INTRAVENOUS
  Administered 2020-01-03: 15:00:00 50 ug via INTRAVENOUS
  Administered 2020-01-03 (×2): 25 ug via INTRAVENOUS
  Administered 2020-01-03: 16:00:00 50 ug via INTRAVENOUS

## 2020-01-03 MED ORDER — DEXAMETHASONE SODIUM PHOSPHATE 4 MG/ML IJ SOLN
INTRAVENOUS | 0 refills | Status: DC
Start: 2020-01-03 — End: 2020-01-03
  Administered 2020-01-03: 14:00:00 4 mg via INTRAVENOUS

## 2020-01-03 MED ORDER — ONDANSETRON HCL (PF) 4 MG/2 ML IJ SOLN
INTRAVENOUS | 0 refills | Status: DC
Start: 2020-01-03 — End: 2020-01-03
  Administered 2020-01-03: 16:00:00 4 mg via INTRAVENOUS

## 2020-01-03 MED ORDER — PROPOFOL 10 MG/ML IV EMUL 100 ML (INFUSION)(AM)(OR)
INTRAVENOUS | 0 refills | Status: DC
Start: 2020-01-03 — End: 2020-01-03
  Administered 2020-01-03: 14:00:00 160 ug/kg/min via INTRAVENOUS

## 2020-01-03 MED ADMIN — LACTATED RINGERS IV SOLP [4318]: 1000 mL | INTRAVENOUS | @ 13:00:00 | Stop: 2020-01-03 | NDC 00338011704

## 2020-01-03 MED ADMIN — FENTANYL CITRATE (PF) 50 MCG/ML IJ SOLN [3037]: 50 ug | INTRAVENOUS | @ 18:00:00 | Stop: 2020-01-03 | NDC 00641602701

## 2020-01-03 MED ADMIN — FENTANYL CITRATE (PF) 50 MCG/ML IJ SOLN [3037]: 25 ug | INTRAVENOUS | @ 17:00:00 | Stop: 2020-01-03 | NDC 00641602701

## 2020-01-03 MED ADMIN — LACTATED RINGERS IV SOLP [4318]: 1800 mL | @ 15:00:00 | Stop: 2020-01-03 | NDC 00338011704

## 2020-01-03 MED ADMIN — OXYCODONE 5 MG PO TAB [10814]: 10 mg | ORAL | @ 17:00:00 | Stop: 2020-01-03 | NDC 00904696661

## 2020-01-03 MED ADMIN — EPINEPHRINE 1 MG/ML (1 ML) IJ SOLN [307529]: 1800 mL | @ 15:00:00 | Stop: 2020-01-03 | NDC 42023015901

## 2020-01-03 MED ADMIN — LIDOCAINE (PF) 10 MG/ML (1 %) IJ SOLN [95838]: 1800 mL | @ 15:00:00 | Stop: 2020-01-03 | NDC 00409427916

## 2020-01-03 MED ADMIN — BUPIVACAINE-EPINEPHRINE 0.25 %-1:200,000 IJ SOLN [14983]: 16 mL | INTRAMUSCULAR | @ 17:00:00 | Stop: 2020-01-03 | NDC 63323046101

## 2020-01-03 MED ADMIN — LACTATED RINGERS IV SOLP [4318]: 1000.000 mL | INTRAVENOUS | @ 17:00:00 | Stop: 2020-01-03 | NDC 00338011704

## 2020-01-03 MED ADMIN — CEFAZOLIN INJ 1GM IVP [210319]: 2 g | INTRAVENOUS | @ 14:00:00 | Stop: 2020-01-03 | NDC 00409080501

## 2020-01-03 MED ADMIN — ACETAMINOPHEN 500 MG PO TAB [102]: 1000 mg | ORAL | @ 13:00:00 | Stop: 2020-01-03 | NDC 00904673061

## 2020-01-05 ENCOUNTER — Encounter: Admit: 2020-01-05 | Discharge: 2020-01-05 | Payer: Private Health Insurance - Indemnity | Primary: Family

## 2020-01-05 DIAGNOSIS — G4700 Insomnia, unspecified: Secondary | ICD-10-CM

## 2020-01-05 DIAGNOSIS — J302 Other seasonal allergic rhinitis: Secondary | ICD-10-CM

## 2020-01-05 DIAGNOSIS — K219 Gastro-esophageal reflux disease without esophagitis: Secondary | ICD-10-CM

## 2020-01-05 DIAGNOSIS — C50919 Malignant neoplasm of unspecified site of unspecified female breast: Secondary | ICD-10-CM

## 2020-01-05 DIAGNOSIS — M1990 Unspecified osteoarthritis, unspecified site: Secondary | ICD-10-CM

## 2020-01-18 ENCOUNTER — Encounter: Admit: 2020-01-18 | Discharge: 2020-01-18 | Payer: Private Health Insurance - Indemnity | Primary: Family

## 2020-01-18 ENCOUNTER — Ambulatory Visit: Admit: 2020-01-18 | Discharge: 2020-01-18 | Payer: Private Health Insurance - Indemnity | Primary: Family

## 2020-01-18 DIAGNOSIS — M1990 Unspecified osteoarthritis, unspecified site: Secondary | ICD-10-CM

## 2020-01-18 DIAGNOSIS — Z421 Encounter for breast reconstruction following mastectomy: Secondary | ICD-10-CM

## 2020-01-18 DIAGNOSIS — G4700 Insomnia, unspecified: Secondary | ICD-10-CM

## 2020-01-18 DIAGNOSIS — K219 Gastro-esophageal reflux disease without esophagitis: Secondary | ICD-10-CM

## 2020-01-18 DIAGNOSIS — C50919 Malignant neoplasm of unspecified site of unspecified female breast: Secondary | ICD-10-CM

## 2020-01-18 DIAGNOSIS — J302 Other seasonal allergic rhinitis: Secondary | ICD-10-CM

## 2020-01-18 NOTE — Patient Instructions
Increase to full activities  Wear supportive bra

## 2020-01-18 NOTE — Progress Notes
Subjective:       History of Present Illness  Patricia Montes is a 54 y.o. female.  01/03/2020 REMOVAL LEFT BREAST TISSUE EXPANDER AND RECONSTRUCTION WITH SIENTRA 91478-295 MP BREAST PROSTHESIS-  REVISION PERI-IMPLANTCAPSULE BREAST WITH CAPSULOTOMY/ PARTIAL CAPSULECTOMY (Left)  BALANCING MASTOPEXY (Right)  GRAFTING AUTOLOGOUS FAT BY LIPOSUCTION TO [id number] CC LEFT AND 66 CC RIGHT; TOTAL 207 CC (Bilateral)  REMOVAL TUNNELED CENTRAL VENOUS ACCESS DEVICE INCLUDING PORT/ PUMP (Right)  Doing well  Pain controlled   Eating well  Bowels good     Review of Systems   Constitutional: Negative.    HENT: Negative.    Eyes: Negative.    Respiratory: Negative.    Cardiovascular: Negative.    Gastrointestinal: Negative.    Endocrine: Negative.    Genitourinary: Negative.    Musculoskeletal: Negative.    Skin: Negative.    Allergic/Immunologic: Negative.    Neurological: Negative.    Hematological: Negative.    Psychiatric/Behavioral: Negative.          Objective:         ? acetaminophen (TYLENOL) 325 mg tablet Take two tablets by mouth every 6 hours. Take scheduled for 3 days after surgery, then as needed. Do not exceed 4,000mg  in a 24 hour period.   ? calcium carbonate (CALCIUM 500 PO) Calcium 500   daily   ? cetirizine (ZYRTEC) 10 mg tablet Take 10 mg by mouth every morning.   ? cholecalciferol (vitamin D3) (VITAMIN D3 PO) Take  by mouth.   ? cyanocobalamin (vitamin B-12) (B-12 KIT IJ) Inject  to area(s) as directed every 28 days.   ? cyclobenzaprine (FLEXERIL) 10 mg tablet Take 10 mg by mouth every 8 hours. Prn   ? diazePAM (VALIUM) 5 mg tablet Take one tablet by mouth every 6 hours as needed (muscle spasm pain). Indications: muscle spasm   ? doxycycline hyclate (VIBRAMYCIN) 100 mg capsule Take one capsule by mouth twice daily. Indications: infection prophylaxis, surgical   ? fluticasone propionate (FLONASE) 50 mcg/actuation nasal spray, suspension Apply 1 spray into nose as directed every 24 hours.   ? fluticasone propionate (FLOVENT DISKUS) 50 mcg/actuation inhaler every 24 hours.   ? gabapentin 300 mg Tb24 Take 1 tablet by mouth twice daily.   ? guaiFENesin LA (MUCINEX) 600 mg tablet Take 600 mg by mouth twice daily as needed.   ? letrozole (FEMARA) 2.5 mg tablet Take 2.5 mg by mouth daily.   ? liraglutide (weight loss) (SAXENDA) 3 mg/0.5 mL (18 mg/3 mL) injection PEN every 24 hours.   ? lisinopriL (ZESTRIL) 5 mg tablet TK 1 TABLET PO DAILY   ? MAGNESIUM CITRATE PO Take  by mouth.   ? multivit-min-iron-FA-lutein (CENTRUM SILVER WOMEN) 8 mg iron-400 mcg-300 mcg tab Take 1 tablet by mouth daily.   ? oxyCODONE (ROXICODONE) 5 mg tablet Take one tablet by mouth every 6 hours as needed for Pain Indications: pain   ? pantoprazole DR (PROTONIX) 20 mg tablet Take 20 mg by mouth at bedtime daily.   ? senna/docusate (SENOKOT-S) 8.6/50 mg tablet Take one tablet by mouth twice daily. Indications: constipation   ? traZODone (DESYREL) 50 mg tablet Take 50 mg by mouth at bedtime as needed.     Vitals:    01/18/20 1053   BP: (!) 149/78   Pulse: 74   Temp: 36.9 ?C (98.4 ?F)   TempSrc: Skin   Weight: 83.6 kg (184 lb 4.8 oz)   Height: 165.1 cm (65)   PainSc: Zero  Body mass index is 30.67 kg/m?Marland Kitchen     Physical Exam  Vitals and nursing note reviewed. Exam conducted with a chaperone present.   Chest:      Breasts:         Right: Normal.         Left: Absent.      Comments: Right healing well  Shape and contour very good  Left-shape and contour very good  Sutures cut  Mastisol/steri  Neurological:      Mental Status: She is alert and oriented to person, place, and time.   Psychiatric:         Behavior: Behavior normal.              Assessment and Plan:  Increase to full activities  Wear supportive bra

## 2020-02-10 ENCOUNTER — Encounter: Admit: 2020-02-10 | Discharge: 2020-02-10 | Payer: Private Health Insurance - Indemnity | Primary: Family

## 2020-04-18 ENCOUNTER — Ambulatory Visit: Admit: 2020-04-18 | Discharge: 2020-04-19 | Payer: Private Health Insurance - Indemnity | Primary: Family

## 2020-04-18 ENCOUNTER — Encounter: Admit: 2020-04-18 | Discharge: 2020-04-18 | Payer: Private Health Insurance - Indemnity | Primary: Family

## 2020-04-18 DIAGNOSIS — G4700 Insomnia, unspecified: Secondary | ICD-10-CM

## 2020-04-18 DIAGNOSIS — C50919 Malignant neoplasm of unspecified site of unspecified female breast: Secondary | ICD-10-CM

## 2020-04-18 DIAGNOSIS — T8544XA Capsular contracture of breast implant, initial encounter: Secondary | ICD-10-CM

## 2020-04-18 DIAGNOSIS — M1990 Unspecified osteoarthritis, unspecified site: Secondary | ICD-10-CM

## 2020-04-18 DIAGNOSIS — J302 Other seasonal allergic rhinitis: Secondary | ICD-10-CM

## 2020-04-18 DIAGNOSIS — N650 Deformity of reconstructed breast: Secondary | ICD-10-CM

## 2020-04-18 DIAGNOSIS — K219 Gastro-esophageal reflux disease without esophagitis: Secondary | ICD-10-CM

## 2020-04-18 DIAGNOSIS — Z421 Encounter for breast reconstruction following mastectomy: Secondary | ICD-10-CM

## 2020-04-18 NOTE — Progress Notes
Date of Service: 04/18/2020    Subjective:             Patricia Montes is a 55 y.o. female.    History of Present Illness  01/03/2020 REMOVAL LEFT BREAST?TISSUE EXPANDER AND RECONSTRUCTION?WITH?SIENTRA 16109-604 MP BREAST?PROSTHESIS-  REVISION PERI-IMPLANTCAPSULE BREAST WITH CAPSULOTOMY/ PARTIAL CAPSULECTOMY (Left)  BALANCING?MASTOPEXY (Right)  GRAFTING AUTOLOGOUS FAT BY LIPOSUCTION TO?[id number]?CC LEFT AND 66?CC RIGHT; TOTAL 207 CC?(Bilateral)  REMOVAL TUNNELED CENTRAL VENOUS ACCESS DEVICE INCLUDING PORT/ PUMP (Right)  LEFT RADIATION   Feels well  Full activities  No drainage  Some sore spots now less obvious     Review of Systems   Constitutional: Negative.    HENT: Negative.    Eyes: Negative.    Respiratory: Negative.    Cardiovascular: Negative.    Gastrointestinal: Negative.    Endocrine: Negative.    Genitourinary: Negative.    Musculoskeletal: Negative.    Skin: Negative.    Allergic/Immunologic: Negative.    Neurological: Negative.    Hematological: Negative.    Psychiatric/Behavioral: Negative.          Objective:         ? acetaminophen (TYLENOL) 325 mg tablet Take two tablets by mouth every 6 hours. Take scheduled for 3 days after surgery, then as needed. Do not exceed 4,000mg  in a 24 hour period.   ? calcium carbonate (CALCIUM 500 PO) Calcium 500   daily   ? cetirizine (ZYRTEC) 10 mg tablet Take 10 mg by mouth every morning.   ? cholecalciferol (vitamin D3) (VITAMIN D3 PO) Take  by mouth.   ? cyanocobalamin (vitamin B-12) (B-12 KIT IJ) Inject  to area(s) as directed every 28 days.   ? cyclobenzaprine (FLEXERIL) 10 mg tablet Take 10 mg by mouth every 8 hours. Prn   ? diazePAM (VALIUM) 5 mg tablet Take one tablet by mouth every 6 hours as needed (muscle spasm pain). Indications: muscle spasm   ? doxycycline hyclate (VIBRAMYCIN) 100 mg capsule Take one capsule by mouth twice daily. Indications: infection prophylaxis, surgical   ? fluticasone propionate (FLONASE) 50 mcg/actuation nasal spray, suspension Apply 1 spray into nose as directed every 24 hours.   ? fluticasone propionate (FLOVENT DISKUS) 50 mcg/actuation inhaler every 24 hours.   ? gabapentin 300 mg Tb24 Take 1 tablet by mouth twice daily.   ? guaiFENesin LA (MUCINEX) 600 mg tablet Take 600 mg by mouth twice daily as needed.   ? letrozole (FEMARA) 2.5 mg tablet Take 2.5 mg by mouth daily.   ? liraglutide (weight loss) (SAXENDA) 3 mg/0.5 mL (18 mg/3 mL) injection PEN every 24 hours.   ? lisinopriL (ZESTRIL) 5 mg tablet TK 1 TABLET PO DAILY   ? MAGNESIUM CITRATE PO Take  by mouth.   ? multivit-min-iron-FA-lutein (CENTRUM SILVER WOMEN) 8 mg iron-400 mcg-300 mcg tab Take 1 tablet by mouth daily.   ? oxyCODONE (ROXICODONE) 5 mg tablet Take one tablet by mouth every 6 hours as needed for Pain Indications: pain   ? pantoprazole DR (PROTONIX) 20 mg tablet Take 20 mg by mouth at bedtime daily.   ? senna/docusate (SENOKOT-S) 8.6/50 mg tablet Take one tablet by mouth twice daily. Indications: constipation   ? traZODone (DESYREL) 50 mg tablet Take 50 mg by mouth at bedtime as needed.     Vitals:    04/18/20 1133   Weight: 83.5 kg (184 lb)   Height: 165.1 cm (65)     Body mass index is 30.62 kg/m?Marland Kitchen  Physical Exam  Right breast overall shape good  Left good position, upper pole more full  Reasonable volume symmetry, left little larger especially upper pole       Assessment and Plan:  Discussed possible revision with combination right tightening, possible upper pole fat graft and left possible capsulotomy, implant downsize (hopefully) and targeted fat graft  Discussed more aggressive lipo as well.  Check in 3 months

## 2020-07-07 ENCOUNTER — Encounter: Admit: 2020-07-07 | Discharge: 2020-07-07 | Payer: Private Health Insurance - Indemnity | Primary: Family

## 2020-07-07 ENCOUNTER — Ambulatory Visit: Admit: 2020-07-07 | Discharge: 2020-07-08 | Payer: Private Health Insurance - Indemnity | Primary: Family

## 2020-07-07 DIAGNOSIS — G4700 Insomnia, unspecified: Secondary | ICD-10-CM

## 2020-07-07 DIAGNOSIS — Z421 Encounter for breast reconstruction following mastectomy: Secondary | ICD-10-CM

## 2020-07-07 DIAGNOSIS — C50919 Malignant neoplasm of unspecified site of unspecified female breast: Secondary | ICD-10-CM

## 2020-07-07 DIAGNOSIS — K219 Gastro-esophageal reflux disease without esophagitis: Secondary | ICD-10-CM

## 2020-07-07 DIAGNOSIS — M1990 Unspecified osteoarthritis, unspecified site: Secondary | ICD-10-CM

## 2020-07-07 DIAGNOSIS — J302 Other seasonal allergic rhinitis: Secondary | ICD-10-CM

## 2020-07-07 DIAGNOSIS — N650 Deformity of reconstructed breast: Secondary | ICD-10-CM

## 2020-11-24 ENCOUNTER — Encounter: Admit: 2020-11-24 | Discharge: 2020-11-24 | Payer: Private Health Insurance - Indemnity | Primary: Family

## 2020-11-30 ENCOUNTER — Encounter: Admit: 2020-11-30 | Discharge: 2020-11-30 | Payer: Private Health Insurance - Indemnity | Primary: Family

## 2021-01-08 ENCOUNTER — Encounter: Admit: 2021-01-08 | Discharge: 2021-01-08 | Payer: Private Health Insurance - Indemnity | Primary: Family

## 2021-01-08 ENCOUNTER — Ambulatory Visit: Admit: 2021-01-08 | Discharge: 2021-01-08 | Payer: Private Health Insurance - Indemnity | Primary: Family

## 2021-01-08 DIAGNOSIS — G4700 Insomnia, unspecified: Secondary | ICD-10-CM

## 2021-01-08 DIAGNOSIS — M1990 Unspecified osteoarthritis, unspecified site: Secondary | ICD-10-CM

## 2021-01-08 DIAGNOSIS — C50919 Malignant neoplasm of unspecified site of unspecified female breast: Secondary | ICD-10-CM

## 2021-01-08 DIAGNOSIS — J302 Other seasonal allergic rhinitis: Secondary | ICD-10-CM

## 2021-01-08 DIAGNOSIS — K219 Gastro-esophageal reflux disease without esophagitis: Secondary | ICD-10-CM

## 2021-01-14 ENCOUNTER — Encounter: Admit: 2021-01-14 | Discharge: 2021-01-14 | Payer: Private Health Insurance - Indemnity | Primary: Family

## 2021-01-14 DIAGNOSIS — J302 Other seasonal allergic rhinitis: Secondary | ICD-10-CM

## 2021-01-14 DIAGNOSIS — G4700 Insomnia, unspecified: Secondary | ICD-10-CM

## 2021-01-14 DIAGNOSIS — K219 Gastro-esophageal reflux disease without esophagitis: Secondary | ICD-10-CM

## 2021-01-14 DIAGNOSIS — C50919 Malignant neoplasm of unspecified site of unspecified female breast: Secondary | ICD-10-CM

## 2021-01-14 DIAGNOSIS — M1990 Unspecified osteoarthritis, unspecified site: Secondary | ICD-10-CM

## 2021-01-21 IMAGING — MG MAMMO BILATERAL SCREENING
8 of 11 series · 8 of 23 positions shown · non-contrast
Comparison: This study was compared to the prior exams of

MAMMO BILATERAL SCREENING  

EXAMINATION: Digital mammogram bilateral screening.
INDICATION: Screening.

[L CC synth-2D]
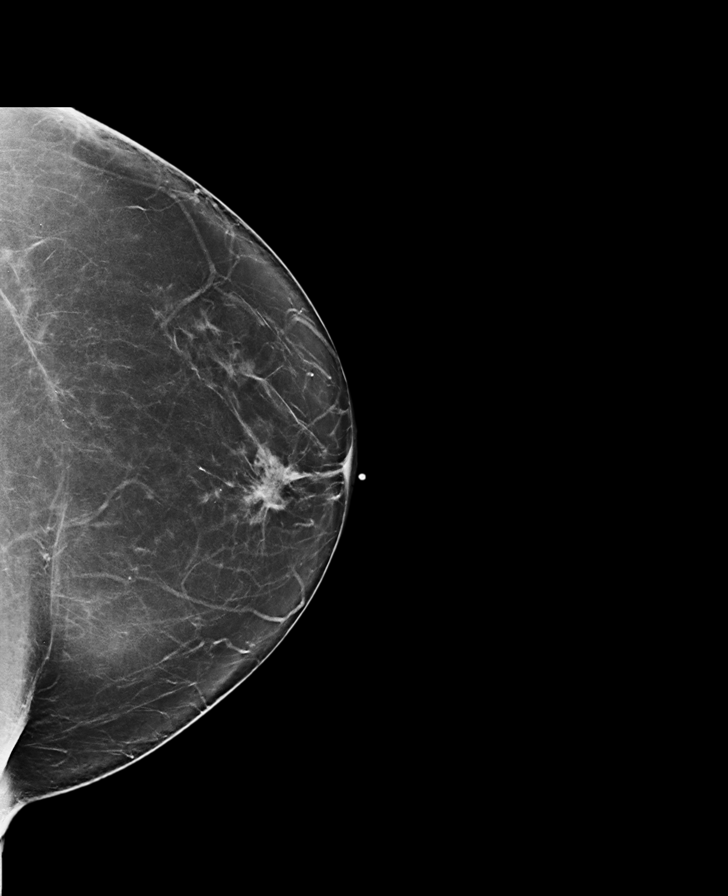

[L CC]
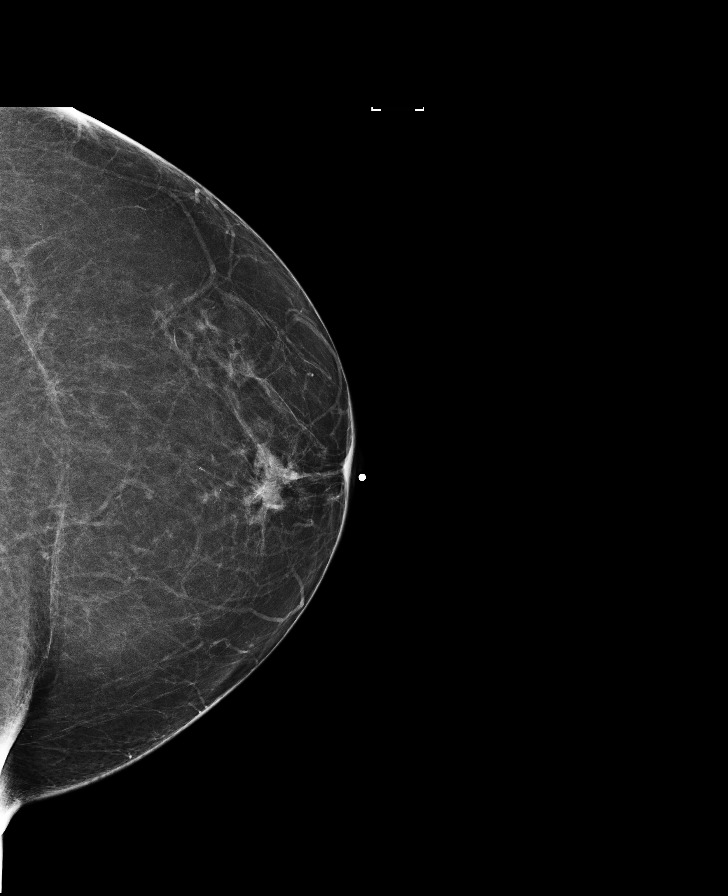

[R MLO synth-2D]
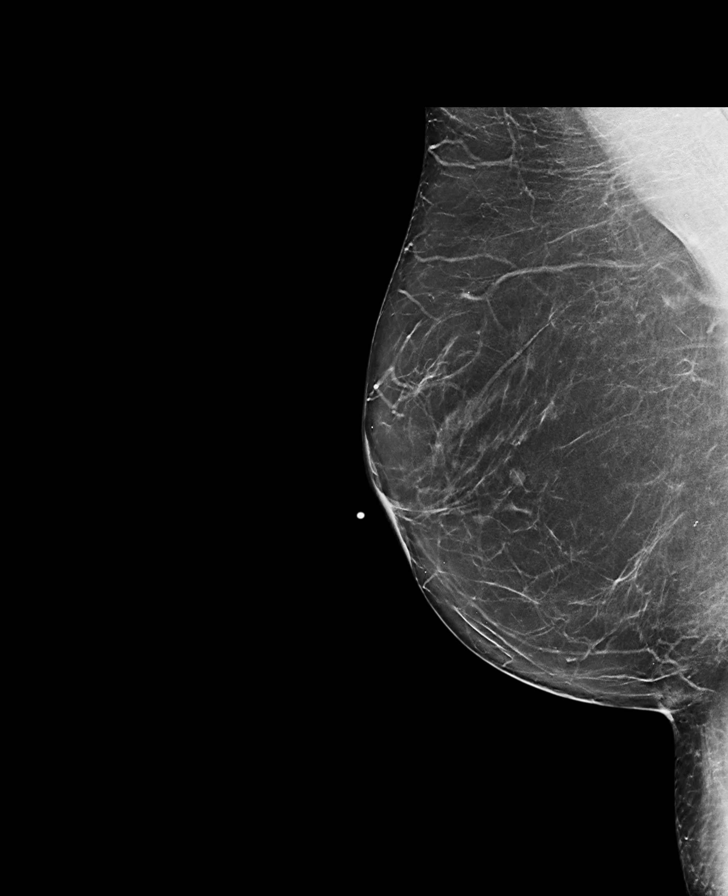

[R CC synth-2D]
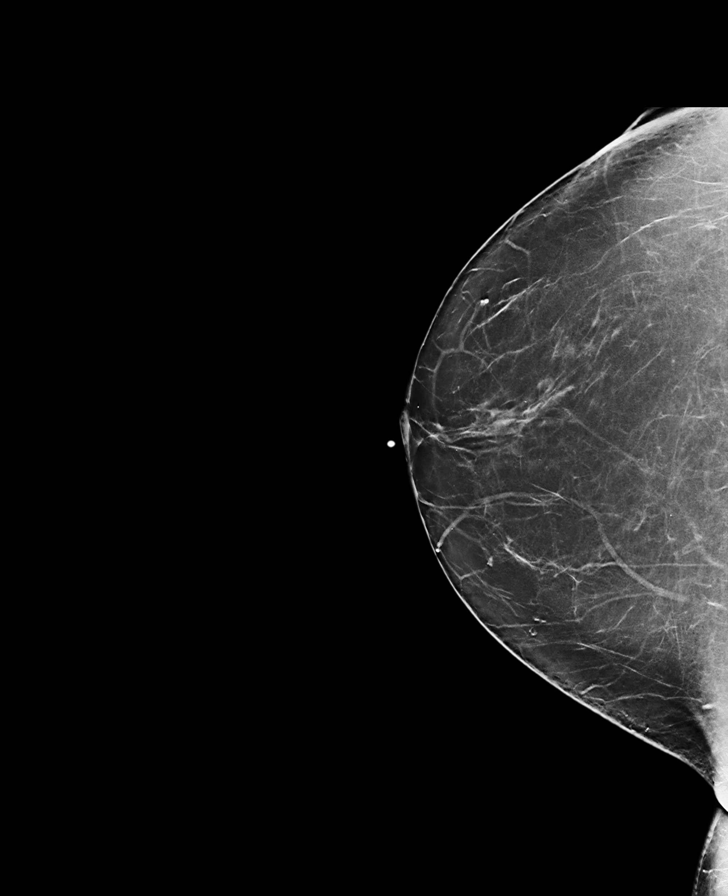

[R MLO]
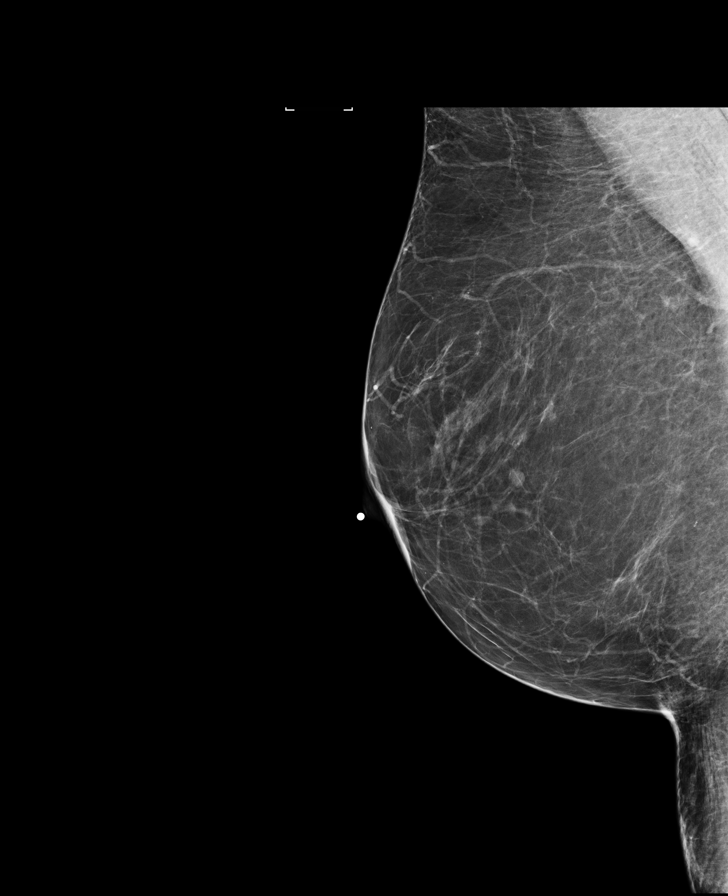

[L MLO]
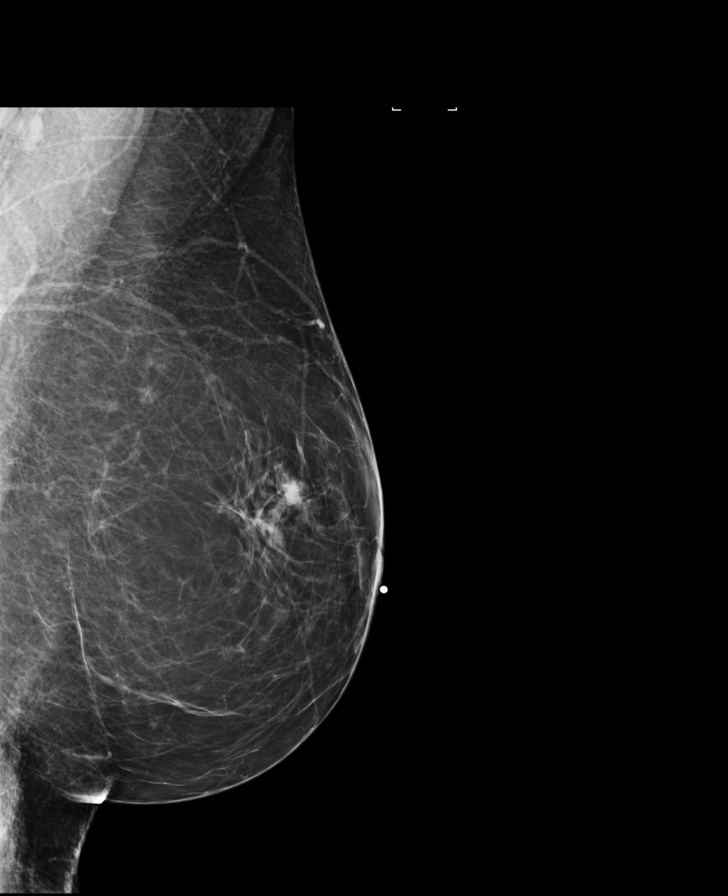

[L MLO synth-2D]
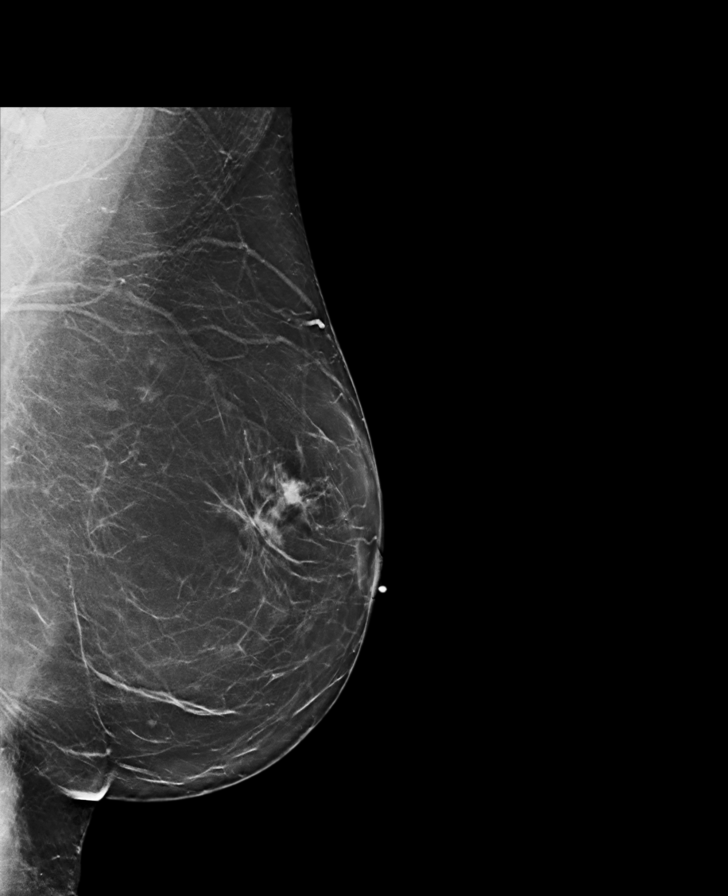

[R CC]
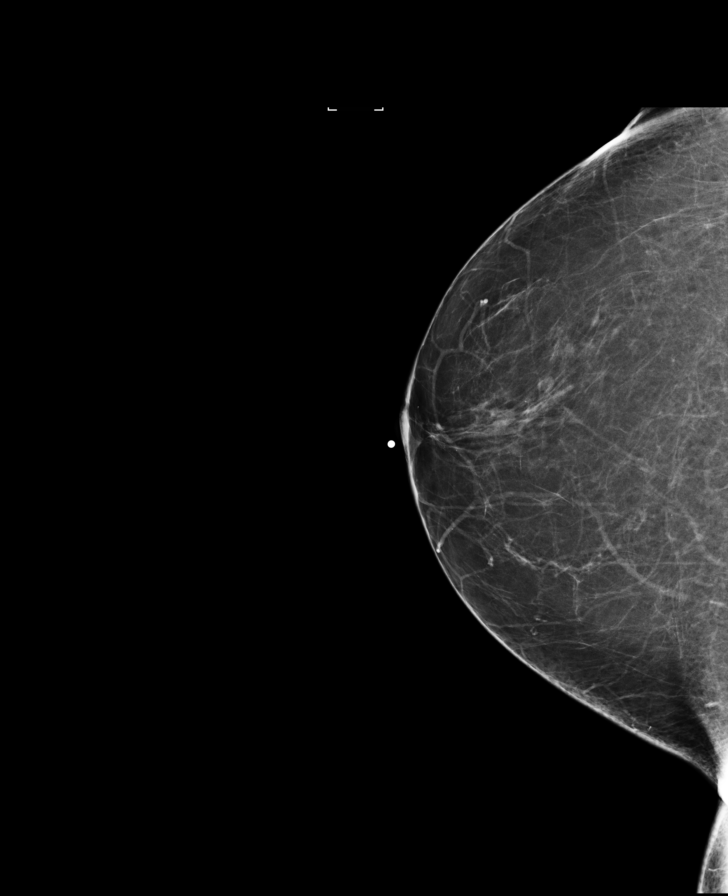

[8 of 23 positions shown; findings below may reference images not displayed]

06/29/2013 and 06/21/2010.  

 At this time, there are no current complaints.  

 The current study was also evaluated with a Computer Aided  

 Detection (CAD) system. 3-D tomosynthesis was also performed and  

 reviewed.
FINDINGS: There are scattered fibroglandular densities in both  

 breasts, which could obscure a lesion. On the MLO view of the  

 left breast in the 12 o'clock position of the breast, there is  

 now a 6 mm asymmetric density lying roughly 3.5 cm from the  

 nipple. This finding is not as conspicuous on the craniocaudad  

 view but still appears to be present on the tomographic images.  

 The development of this density since the prior exam does make it  

 worrisome for malignancy. I would recommend that compression  

 views of this area be obtained in the CC and MLO projections for  

 further evaluation. Ultrasound should also be performed.  

 The right breast is unremarkable for malignancy.
IMPRESSION: Additional mammographic views and ultrasound of the  

 left breast will be recommended for further study.  

 ACR BI-RADS Category 0: Incomplete. (Needs additional imaging  

 evaluation).  

 Result letter will be mailed to the patient.  

 Note: At least 10% of breast cancer is not imaged by mammography.  

MK [id number]

## 2021-06-15 ENCOUNTER — Encounter: Admit: 2021-06-15 | Discharge: 2021-06-15 | Payer: Private Health Insurance - Indemnity | Primary: Family

## 2021-06-25 ENCOUNTER — Encounter: Admit: 2021-06-25 | Discharge: 2021-06-25 | Payer: Private Health Insurance - Indemnity | Primary: Family

## 2021-06-25 ENCOUNTER — Ambulatory Visit: Admit: 2021-06-25 | Discharge: 2021-06-26 | Payer: Private Health Insurance - Indemnity | Primary: Family

## 2021-06-25 DIAGNOSIS — Z853 Personal history of malignant neoplasm of breast: Secondary | ICD-10-CM

## 2021-06-25 DIAGNOSIS — C50919 Malignant neoplasm of unspecified site of unspecified female breast: Secondary | ICD-10-CM

## 2021-06-25 DIAGNOSIS — M1990 Unspecified osteoarthritis, unspecified site: Secondary | ICD-10-CM

## 2021-06-25 DIAGNOSIS — N650 Deformity of reconstructed breast: Secondary | ICD-10-CM

## 2021-06-25 DIAGNOSIS — J302 Other seasonal allergic rhinitis: Secondary | ICD-10-CM

## 2021-06-25 DIAGNOSIS — K219 Gastro-esophageal reflux disease without esophagitis: Secondary | ICD-10-CM

## 2021-06-25 DIAGNOSIS — G4700 Insomnia, unspecified: Secondary | ICD-10-CM

## 2021-06-25 NOTE — Progress Notes
Subjective:       History of Present Illness  Patricia Montes is a 56 y.o. female.     Cancer Staging   Malignant neoplasm of central portion of left breast in female, estrogen receptor positive (HCC)  Staging form: Breast, AJCC 8th Edition  - Clinical stage from 06/15/2018: Stage IA (cT1c, cN0, cM0, G3, ER+, PR+, HER2+) - Signed by Andrew Au, PA-C on 07/13/2018  - Pathologic: No Stage Recommended (ypT2, pN1, cM0, G3, ER+, PR+, HER2+) - Signed by Neldon Mc, DO on 12/17/2018    Patricia Montes presents as a referral from Dr. Loney Laurence for discussion regarding autologous breast reconstruction. Patient is s/p L SSM and TE placement in 11/2018 and s/p L TE to implant, R balancing mastopexy, and FG to bilateral breasts from chest, abdomen, and flanks in 12/2019. Patient completed PMRT in 02/2019. Patient reports that she is unhappy with the symmetry of her current reconstruction as her L reconstructed breast is very firm and sits significantly higher on her chest than her R native breast. She also endorses pain in the medial aspect and axillary aspect of the L reconstructed breast. She feels that her L reconstructed breast has become more malpositioned with time. Endorses a remote history of BBR and abdominoplasty in 2001. Is unhappy with the cosmesis of her abdominoplasty, particularly in her lateral lower abdomen where she has some redundant tissue. Denies history of DVT/PE. Denies history of bleeding/clotting disorders. Reports history of 1 miscarriage, 3 live births.     PMH: L breast cancer, HTN  PSH: BBR (2001), abdominoplasty (2001), laparoscopic hysterectomy and cystopexy, L SSM & TE (2020), L TE to implant, R mastopexy, B FG (2021)  Soc Hx: Denies tobacco, alcohol, drug use. Husband smokes cigarettes, sometimes in the home. 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   FH: father with MI, no bleeding/clotting disorders  Meds: Anastrozole, Lisinopril 5 mg, Trazodone 50 mg qhs, Calcium, Vit D, Magnesium. Reports discontinued gabapentin.        Review of Systems   Constitutional: Negative.    HENT: Negative.    Eyes: Negative.    Respiratory: Negative.    Cardiovascular: Negative.    Gastrointestinal: Negative.    Endocrine: Negative.    Genitourinary: Negative.    Musculoskeletal: Negative.    Skin: Negative.    Allergic/Immunologic: Negative.    Neurological: Negative.    Hematological: Negative.    Psychiatric/Behavioral: Negative.          Objective:         ? anastrozole (ARIMIDEX) 1 mg tablet Take one tablet by mouth daily.   ? calcium carbonate (CALCIUM 500 PO) Calcium 500   daily   ? cholecalciferol (vitamin D3) (VITAMIN D3 PO) Take  by mouth.   ? cyanocobalamin (vitamin B-12) (B-12 KIT IJ) Inject  to area(s) as directed every 28 days.   ? fluticasone propionate (FLONASE) 50 mcg/actuation nasal spray, suspension Apply one spray into nose as directed every 24 hours.   ? fluticasone propionate (FLOVENT DISKUS) 50 mcg/actuation inhaler every 24 hours.   ? gabapentin 300 mg Tb24 Take one tablet by mouth twice daily.   ? lisinopriL (ZESTRIL) 5 mg tablet TK 1 TABLET PO DAILY   ? MAGNESIUM CITRATE PO Take  by mouth.   ? multivit-min-iron-FA-lutein (CENTRUM SILVER WOMEN) 8 mg iron-400 mcg-300 mcg tab Take 1 tablet by mouth daily.   ? pantoprazole DR (PROTONIX) 20 mg tablet Take one tablet by mouth at bedtime daily.   ?  traZODone (DESYREL) 50 mg tablet Take one tablet by mouth at bedtime as needed.     Vitals:    06/25/21 0858   PainSc: Zero     There is no height or weight on file to calculate BMI.     Physical Exam  Constitutional:       General: She is not in acute distress.     Appearance: Normal appearance. She is normal weight. She is not toxic-appearing.   HENT:      Head: Normocephalic and atraumatic.      Nose: Nose normal.      Mouth/Throat:      Mouth: Mucous membranes are moist.   Eyes:      Extraocular Movements: Extraocular movements intact.   Pulmonary:      Effort: Pulmonary effort is normal.   Chest:      Comments: L chest w/ implant reconstruction. SSM transverse scar and inframammary scar. Firm, round. Baker IV capsular contracture. Top of implant ~3 finger breadths from clavicle. No fluid collections, redness.     R native breast with inframammary and circumareolar scarring. Grade 2 ptosis.   Abdominal:      General: There is no distension.      Palpations: Abdomen is soft. There is no mass.      Tenderness: There is no abdominal tenderness.      Comments: Prior abdominoplasty scar and umbilical scar. Lateral tissue excess.    Musculoskeletal:         General: Normal range of motion.   Skin:     General: Skin is warm and dry.   Neurological:      General: No focal deficit present.      Mental Status: She is alert.   Psychiatric:         Mood and Affect: Mood normal.         Behavior: Behavior normal.         Thought Content: Thought content normal.         Judgment: Judgment normal.              Assessment and Plan:  Patricia Montes is a 56 F with PMH of L BC s/p L SSM and TE placement in 11/2018 and s/p L TE to implant, R balancing mastopexy, and FG to bilateral breasts from chest, abdomen, and flanks in 12/2019 who presents as a referral from Dr. Loney Laurence for discussion regarding autologous breast reconstruction.    - Discussed PAP flaps including surgical plan, postop recovery, expected course, and possible complications including hematoma, seroma, flap loss, delayed wound healing, scarring, pain, and need for revision procedures.  - Plan for stacked PAP flaps to L chest  - Will obtain CTA of bilateral lower extremities to assess for adequate perforators  - RTC for pre-op appointment prior to procedure  - Patient instructed to call next week to schedule CTA and surgery    Suszanne Conners, MD  Plastic Surgery PGY2      She is overall happy with her right mastopexy and would like to have something that matches that more closely.  She reports discomfort with the capsular contracture that she has from the radiation on the left side and feels that the implant is too round.  Certainly is very high riding compared to the right breast.  We therefore discussed stacked Paps, skin replacement, and lowering the reconstruction.  She is interested in moving forward with this.    Plan:  1) CTA of the  thighs to look at profunda artery perforators  2) stacked PAP's for left breast reconstruction        ATTESTATION    I personally performed the key portions of the E/M visit, discussed case with resident and concur with resident documentation of history, physical exam, assessment, and treatment plan unless otherwise noted.    Staff name:  Reyne Dumas, MD Date: 06/25/2021       45 minutes was spent with the patient, greater than 60% of which was counseling.

## 2021-06-26 ENCOUNTER — Encounter: Admit: 2021-06-26 | Discharge: 2021-06-26 | Payer: Private Health Insurance - Indemnity | Primary: Family

## 2021-06-26 DIAGNOSIS — N650 Deformity of reconstructed breast: Secondary | ICD-10-CM

## 2021-06-26 DIAGNOSIS — Z853 Personal history of malignant neoplasm of breast: Secondary | ICD-10-CM

## 2021-06-29 ENCOUNTER — Encounter: Admit: 2021-06-29 | Discharge: 2021-06-29 | Payer: Private Health Insurance - Indemnity | Primary: Family

## 2021-07-04 ENCOUNTER — Encounter: Admit: 2021-07-04 | Discharge: 2021-07-04 | Payer: Private Health Insurance - Indemnity | Primary: Family

## 2021-07-04 NOTE — Telephone Encounter
Lvm for patient to discuss PAP flap surgery scheduling.  Return phone number and request for callback left for patient.

## 2021-07-05 ENCOUNTER — Encounter: Admit: 2021-07-05 | Discharge: 2021-07-05 | Payer: Private Health Insurance - Indemnity | Primary: Family

## 2021-07-05 ENCOUNTER — Ambulatory Visit: Admit: 2021-07-05 | Discharge: 2021-07-05 | Payer: Private Health Insurance - Indemnity | Primary: Family

## 2021-07-05 DIAGNOSIS — Z853 Personal history of malignant neoplasm of breast: Secondary | ICD-10-CM

## 2021-07-05 DIAGNOSIS — N650 Deformity of reconstructed breast: Secondary | ICD-10-CM

## 2021-07-05 MED ORDER — SODIUM CHLORIDE 0.9 % IJ SOLN
50 mL | Freq: Once | INTRAVENOUS | 0 refills | Status: CP
Start: 2021-07-05 — End: ?
  Administered 2021-07-05: 13:00:00 50 mL via INTRAVENOUS

## 2021-07-05 MED ORDER — IOHEXOL 350 MG IODINE/ML IV SOLN
100 mL | Freq: Once | INTRAVENOUS | 0 refills | Status: CP
Start: 2021-07-05 — End: ?
  Administered 2021-07-05: 13:00:00 100 mL via INTRAVENOUS

## 2021-07-09 ENCOUNTER — Encounter: Admit: 2021-07-09 | Discharge: 2021-07-09 | Payer: Private Health Insurance - Indemnity | Primary: Family

## 2021-07-09 NOTE — Telephone Encounter
Lvm for patient to discuss surgery scheduling. Return phone number and request for callback left for patient.

## 2021-07-10 ENCOUNTER — Encounter: Admit: 2021-07-10 | Discharge: 2021-07-10 | Payer: Private Health Insurance - Indemnity | Primary: Family

## 2021-07-10 ENCOUNTER — Inpatient Hospital Stay: Admit: 2021-07-10 | Discharge: 2021-07-10 | Payer: Private Health Insurance - Indemnity | Primary: Family

## 2021-07-10 DIAGNOSIS — Z853 Personal history of malignant neoplasm of breast: Secondary | ICD-10-CM

## 2021-07-10 DIAGNOSIS — Z01818 Encounter for other preprocedural examination: Secondary | ICD-10-CM

## 2021-07-10 DIAGNOSIS — N650 Deformity of reconstructed breast: Secondary | ICD-10-CM

## 2021-10-15 ENCOUNTER — Encounter: Admit: 2021-10-15 | Discharge: 2021-10-15 | Payer: Private Health Insurance - Indemnity | Primary: Family

## 2021-12-24 ENCOUNTER — Ambulatory Visit: Admit: 2021-12-24 | Discharge: 2021-12-24 | Payer: Private Health Insurance - Indemnity | Primary: Family

## 2021-12-24 ENCOUNTER — Encounter: Admit: 2021-12-24 | Discharge: 2021-12-24 | Payer: Private Health Insurance - Indemnity | Primary: Family

## 2021-12-24 DIAGNOSIS — N650 Deformity of reconstructed breast: Secondary | ICD-10-CM

## 2021-12-24 DIAGNOSIS — M1990 Unspecified osteoarthritis, unspecified site: Secondary | ICD-10-CM

## 2021-12-24 DIAGNOSIS — C50919 Malignant neoplasm of unspecified site of unspecified female breast: Secondary | ICD-10-CM

## 2021-12-24 DIAGNOSIS — K219 Gastro-esophageal reflux disease without esophagitis: Secondary | ICD-10-CM

## 2021-12-24 DIAGNOSIS — Z853 Personal history of malignant neoplasm of breast: Secondary | ICD-10-CM

## 2021-12-24 DIAGNOSIS — G4700 Insomnia, unspecified: Secondary | ICD-10-CM

## 2021-12-24 DIAGNOSIS — Z01818 Encounter for other preprocedural examination: Secondary | ICD-10-CM

## 2021-12-24 DIAGNOSIS — J302 Other seasonal allergic rhinitis: Secondary | ICD-10-CM

## 2021-12-24 LAB — CBC AND DIFF
ABSOLUTE BASO COUNT: 0 K/UL (ref 0–0.20)
ABSOLUTE EOS COUNT: 0.1 K/UL (ref 0–0.45)
ABSOLUTE LYMPH COUNT: 2.4 K/UL (ref 1.0–4.8)
ABSOLUTE MONO COUNT: 0.7 K/UL (ref 0–0.80)
ABSOLUTE NEUTROPHIL: 4.8 K/UL (ref 1.8–7.0)
BASOPHILS %: 1 % (ref 0–2)
EOSINOPHILS %: 1 % (ref 0–5)
HEMATOCRIT: 39 % (ref 36–45)
LYMPHOCYTES %: 30 % (ref 24–44)
MCH: 30 pg (ref 26–34)
MCHC: 34 g/dL (ref 32.0–36.0)
MCV: 89 FL (ref 80–100)
MONOCYTES %: 9 % (ref 4–12)
MPV: 7.9 FL (ref 7–11)
NEUTROPHILS %: 59 % (ref 41–77)
PLATELET COUNT: 267 K/UL (ref 150–400)
RBC COUNT: 4.3 M/UL (ref 4.0–5.0)
RDW: 13 % (ref 11–15)
WBC COUNT: 8.2 K/UL (ref 4.5–11.0)

## 2021-12-24 LAB — COMPREHENSIVE METABOLIC PANEL
ALBUMIN: 4.1 g/dL (ref 3.5–5.0)
ALK PHOSPHATASE: 115 U/L — ABNORMAL HIGH (ref 25–110)
ALT: 21 U/L (ref 7–56)
ANION GAP: 9 (ref 3–12)
AST: 20 U/L (ref 7–40)
BLD UREA NITROGEN: 20 mg/dL (ref 7–25)
CALCIUM: 9.3 mg/dL (ref 8.5–10.6)
CHLORIDE: 104 MMOL/L (ref 98–110)
CO2: 25 MMOL/L (ref 21–30)
CREATININE: 0.8 mg/dL (ref 0.4–1.00)
EGFR: >60 mL/min (ref >60)
GLUCOSE,PANEL: 95 mg/dL (ref 70–100)
POTASSIUM: 4.2 MMOL/L (ref 3.5–5.1)
SODIUM: 138 MMOL/L (ref 137–147)
TOTAL BILIRUBIN: 0.4 mg/dL (ref 0.3–1.2)
TOTAL PROTEIN: 7.4 g/dL (ref 6.0–8.0)

## 2021-12-24 LAB — PTT (APTT): PTT: 33 s (ref 24.0–36.5)

## 2021-12-24 LAB — PROTIME INR (PT)
INR: 1 (ref 0.8–1.2)
PROTIME: 10 s (ref 9.5–14.2)

## 2021-12-24 NOTE — Pre-Anesthesia Patient Instructions
PREPROCEDURE INFORMATION    Arrival at the hospital  [facility]  [address]  [place], [place] [address]    Park in the [employer], located directly across from the main entrance to the hospital.  Enter through the ground floor main hospital entrance and check in at the Information Desk in the lobby.  They will validate your parking ticket and direct you to the next location.  If you are a woman between the ages of 21 and 83, and have not had a hysterectomy, you will be asked for a urine sample prior to surgery.  Please do not urinate before arriving in the Surgery Waiting Room.  Once there, check in and let the attendant know if you need to provide a sample.    You will receive a call with your surgery arrival time between 2:30pm and 4:30pm the last business day before your procedure.  If you do not receive a call, please call [phone] before 4:30pm or [phone] after 4:30pm.    Eating or drinking before surgery  Nothing to eat after 11:00pm the night before your surgery including gum, mints and candy. You may have clear liquids up to 2 hours before your surgery time.     If you have received specific instructions from your surgeon, please follow those.     Clear Liquid Examples:   Water  Clear juice - Apple or cranberry (no pulp or orange juice) - If diabetic blood sugar must be <200  Coffee and tea with or without sugar (no cream)   Sports drinks - Powerade/Gatorade   Soda   Bowel Prep solutions only if ordered by your surgeon      Planning transportation for outpatient procedure  For your safety, you will need to arrange for a responsible ride/person to accompany you home due to sedation or anesthesia with your procedure.  An [employer], taxi or other public transportation driver is not considered a responsible person to accompany you home.    Bath/Shower Instructions  Take a bath or shower using the special soap given to you in PAC. Use half the bottle the night before, and the other half the morning of your procedure. Use clean towels with each bath or shower.  Put on clean clothes after bath or shower.  Avoid using lotion and oils.  If you are having surgery above the waist, wear a shirt that fastens up the front.  Sleep on clean sheets if bath or shower is done the night before procedure.    Morning of your procedure:  Brush your teeth and tongue  Do not smoke, vape, chew or user any tobacco products.  Do not shave the area where you will have surgery.  Remove nail polish, makeup and all jewelry (including piercings) before coming to the hospital.  Dress in clean, loose, comfortable clothing.    Valuables  Leave money, credit cards, jewelry, and any other valuables at home. [facility] is not responsible for the loss or breakage of personal items.    What to bring to the hospital  ID/Insurance card  Medical Device card  Official documents for legal guardianship  Copy of your Living Will, Advanced Directives, and/or Durable Power of Attorney. If you have these documents, please bring them to the admissions office on the day of your surgery to be scanned into your records.  Do not bring medications from home unless instructed by a pharmacist.  CPAP/BiPAP machine (including all supplies)  Walker, cane,  or motorized scooter  Cases for glasses/hearing aids/contact lens (bring solutions for contacts)  Stimulator remote     Notify us at [facility]: [phone] on the day of your procedure if:  You need to cancel your procedure.  You are going to be late.    Notify your surgeon if:  There is a possibility that you are pregnant.  You become ill with a cough, fever, sore throat, nausea, vomiting or flu-like symptoms.  You have any open wounds/sores that are red, painful, draining, or are new since you last saw the doctor.  You need to cancel your procedure.    Preparing to get your medications at discharge  Your surgeon may prescribe you medications to take after your procedure.  If you like the convenience of having your medications filled here at [place], please do the following:  Go to [place] pharmacy after your [facility] appointment to put a credit card on file.  Call [place] pharmacy at [phone] (Monday-Friday 7am-9pm or Saturday and Sunday 9am-5pm) to put a credit card on file.  Bring a credit card or cash on the day of your procedure- please leave with a family member rather than bringing it into the preop area.    Current Visitor Policy:  Visitors must be free of fever and symptoms to be in our facilities.  No more than 2 visitors per patient are allowed.  Additional guidelines may vary, based on patient care area or patient's condition.  Patients in semiprivate rooms may have visitors, but visits should be coordinated so only two total visitors are in a room at a time due to space limitations.  Children younger than age 80 are allowed to visit inpatients.    Thank you for participating in your Preoperative Assessment Clinic visit today.    If you have any changes to your health or hospitalizations between now and your surgery, please call us at [phone] for Main instructions.    Instructions given to patient via: MyChart, copy given at [facility] appt.

## 2021-12-24 NOTE — Unmapped
[facility] Anesthesia Pre-Procedure Evaluation    Name: Patricia Montes      MRN: [id number]     DOB: 25-Oct-1965     Age: 56 y.o.     Sex: female   _________________________________________________________________________     Procedure Info:   Procedure Information     Date/Time: 01/15/22 0800    Procedures:       RECONSTRUCTION BREAST WITH FREE FLAP-STACKED PAP FLAPS FOR LEFT BREAST RECONSTRUCTION (Left) - CASE LENGTH 8 HOURS, NEED: SPY, MICROSCOPE, LARGE OR, ADMIT TO THE BURN UNIT, BED IN PACU      INTRAVENOUS INJECTION AGENT TO TEST VASCULAR FLOW IN FLAP/ GRAFT (Left)    Location: MAIN OR 19 / Main OR/Periop    Surgeons: Reyne Dumas, MD          Physical Assessment  Vital Signs (last filed in past 24 hours):         Patient History   Allergies   Allergen Reactions   ? Seasonal Allergies RHINITIS and COUGH     Sinus drainage    ? Triple Antibiotic [Neomy-Bacit-Polymyx-Pramoxine] SEE COMMENTS     Petrolium base products         Current Medications    Medication Directions   anastrozole (ARIMIDEX) 1 mg tablet Take one tablet by mouth daily.   calcium carbonate (CALCIUM 500 PO) Calcium 500   daily   cholecalciferol (vitamin D3) (VITAMIN D3 PO) Take  by mouth.   cyanocobalamin (vitamin B-12) (B-12 KIT IJ) Inject  to area(s) as directed every 28 days.   fluticasone propionate (FLONASE) 50 mcg/actuation nasal spray, suspension Apply one spray into nose as directed every 24 hours.   fluticasone propionate (FLOVENT DISKUS) 50 mcg/actuation inhaler every 24 hours.   gabapentin 300 mg Tb24 Take one tablet by mouth twice daily.   lisinopriL (ZESTRIL) 5 mg tablet TK 1 TABLET PO DAILY   MAGNESIUM CITRATE PO Take  by mouth.   multivit-min-iron-FA-lutein (CENTRUM SILVER WOMEN) 8 mg iron-400 mcg-300 mcg tab Take 1 tablet by mouth daily.   pantoprazole DR (PROTONIX) 20 mg tablet Take one tablet by mouth at bedtime daily.   traZODone (DESYREL) 50 mg tablet Take one tablet by mouth at bedtime as needed.       Review of Systems/Medical History        PONV Screening: Non-smoker and Female sex  No history of anesthetic complications  No family history of anesthetic complications        Pulmonary          No indications/hx of asthma (seasonal allergies, remote inhaler use)      No Obstructive Sleep Apnea      Cardiovascular         Exercise tolerance: <4 METS      Hypertension,                   GI/Hepatic/Renal           GERD (food triggered, PRN PPI use ),       No liver disease (Hx of elevated LFTs d/t prescribed Tylenol use):               Musculoskeletal         Arthritis (b/l knee):         Endocrine/Other             Malignancy (left breast CA s/p mastectomy, chemotherapy (in remission)):        Not pregnant (previous hysterectomy); QM:VHQIONG, GP:G3P3  Constitution - negative       Physical Exam    Airway Findings      Mallampati: I      TM distance: >3 FB      Neck ROM: full      Mouth opening: good      Upper Lip Bite Test: 1    Dental Findings: Negative      Cardiovascular Findings:       Rhythm: regular      Rate: normal      No murmur, no carotid bruit    Pulmonary Findings:       Breath sounds clear to auscultation.    Abdominal Findings:       Obese    Neurological Findings:       Alert and oriented x 3    Constitutional findings:       No acute distress      Well-developed       Diagnostic Tests  Hematology:   Lab Results   Component Value Date    HGB 11.8 11/17/2018    HCT 35.5 11/17/2018    PLTCT 365 11/17/2018    WBC 9.7 11/17/2018    NEUT 58 11/17/2018    ANC 5.62 11/17/2018    ALC 2.88 11/17/2018    MONA 11 11/17/2018    AMC 1.10 11/17/2018    EOSA 1 11/17/2018    ABC 0.04 11/17/2018    MCV 97.8 11/17/2018    MCH 32.6 11/17/2018    MCHC 33.3 11/17/2018    MPV 7.4 11/17/2018    RDW 15.3 11/17/2018         General Chemistry:   Lab Results   Component Value Date    NA 139 11/17/2018    K 3.9 11/17/2018    CL 104 11/17/2018    CO2 27 11/17/2018    GAP 8 11/17/2018    BUN 35 11/17/2018    CR 1.08 11/17/2018    GLU 83 11/17/2018    CA 9.5 11/17/2018    ALBUMIN 4.3 11/17/2018    TOTBILI 0.4 11/17/2018      Coagulation: No results found for: PT, PTT, INR    PAC RISK ASSESSMENTS:   *D[facility] Activity Status Index (DASI): 2.75  , Calculated METS: 3.08   (score < 25 correlates with increased risk for death, MI, and moderate to severe complications, max score 57)  *STOP-BANG Score: 3   (total 5-8 high risk for OSA, 3-4 with HCO3 >28 also high risk. OSA associated with greater than twice the odds for respiratory failure, cardiac events, ICU admission, and difficult intubation)      PAC Plan    Interview: Clinic Interview    ASA Score: 2    Anesthesia Options Discussed: General and Local              Labs/Studies Ordered Prior to DOS: (per surgeon)  CBC, CMP, PT/INR and T&S  DOS Orders:  T&C

## 2022-01-15 ENCOUNTER — Inpatient Hospital Stay: Admit: 2022-01-15 | Discharge: 2022-01-15 | Payer: Private Health Insurance - Indemnity | Primary: Family

## 2022-01-15 ENCOUNTER — Encounter: Admit: 2022-01-15 | Discharge: 2022-01-15 | Payer: Private Health Insurance - Indemnity | Primary: Family

## 2022-01-15 DIAGNOSIS — M1990 Unspecified osteoarthritis, unspecified site: Secondary | ICD-10-CM

## 2022-01-15 DIAGNOSIS — G4700 Insomnia, unspecified: Secondary | ICD-10-CM

## 2022-01-15 DIAGNOSIS — J302 Other seasonal allergic rhinitis: Secondary | ICD-10-CM

## 2022-01-15 DIAGNOSIS — K219 Gastro-esophageal reflux disease without esophagitis: Secondary | ICD-10-CM

## 2022-01-15 DIAGNOSIS — C50919 Malignant neoplasm of unspecified site of unspecified female breast: Secondary | ICD-10-CM

## 2022-01-15 MED ORDER — ELECTROLYTE-A IV SOLP
INTRAVENOUS | 0 refills | Status: DC
Start: 2022-01-15 — End: 2022-01-15

## 2022-01-15 MED ORDER — HYDROMORPHONE (PF) 2 MG/ML IJ SYRG
INTRAVENOUS | 0 refills | Status: DC
Start: 2022-01-15 — End: 2022-01-15

## 2022-01-15 MED ORDER — CEFAZOLIN 1 GRAM IJ SOLR
INTRAVENOUS | 0 refills | Status: DC
Start: 2022-01-15 — End: 2022-01-15

## 2022-01-15 MED ORDER — ACETAMINOPHEN 1,000 MG/100 ML (10 MG/ML) IV SOLN
INTRAVENOUS | 0 refills | Status: DC
Start: 2022-01-15 — End: 2022-01-15

## 2022-01-15 MED ORDER — DEXAMETHASONE SODIUM PHOSPHATE 4 MG/ML IJ SOLN
INTRAVENOUS | 0 refills | Status: DC
Start: 2022-01-15 — End: 2022-01-15

## 2022-01-15 MED ORDER — FENTANYL CITRATE (PF) 50 MCG/ML IJ SOLN
INTRAVENOUS | 0 refills | Status: DC
Start: 2022-01-15 — End: 2022-01-15

## 2022-01-15 MED ORDER — VECURONIUM BROMIDE 10 MG IV SOLR
INTRAVENOUS | 0 refills | Status: DC
Start: 2022-01-15 — End: 2022-01-15

## 2022-01-15 MED ORDER — ALBUMIN, HUMAN 5 % 250 ML IV SOLP (AN)(OSM)
INTRAVENOUS | 0 refills | Status: DC
Start: 2022-01-15 — End: 2022-01-15

## 2022-01-15 MED ORDER — DEXMEDETOMIDINE IN 0.9 % NACL 20 MCG/5 ML (4 MCG/ML) IV SYRG
INTRAVENOUS | 0 refills | Status: DC
Start: 2022-01-15 — End: 2022-01-15

## 2022-01-15 MED ORDER — SUGAMMADEX 100 MG/ML IV SOLN
INTRAVENOUS | 0 refills | Status: DC
Start: 2022-01-15 — End: 2022-01-15

## 2022-01-15 MED ORDER — LIDOCAINE (PF) 200 MG/10 ML (2 %) IJ SYRG
INTRAVENOUS | 0 refills | Status: DC
Start: 2022-01-15 — End: 2022-01-15

## 2022-01-15 MED ORDER — ONDANSETRON HCL (PF) 4 MG/2 ML IJ SOLN
INTRAVENOUS | 0 refills | Status: DC
Start: 2022-01-15 — End: 2022-01-15

## 2022-01-15 MED ORDER — ROCURONIUM 10 MG/ML IV SOLN
INTRAVENOUS | 0 refills | Status: DC
Start: 2022-01-15 — End: 2022-01-15

## 2022-01-15 MED ORDER — INDOCYANINE GREEN 25 MG IJ SOLR
INTRAVENOUS | 0 refills | Status: DC
Start: 2022-01-15 — End: 2022-01-15

## 2022-01-15 MED ORDER — PROPOFOL INJ 10 MG/ML IV VIAL
INTRAVENOUS | 0 refills | Status: DC
Start: 2022-01-15 — End: 2022-01-15

## 2022-01-15 MED ORDER — PHENYLEPHRINE HCL IN 0.9% NACL 1 MG/10 ML (100 MCG/ML) IV SYRG
INTRAVENOUS | 0 refills | Status: DC
Start: 2022-01-15 — End: 2022-01-15

## 2022-01-15 MED ADMIN — POLYETHYLENE GLYCOL 3350 17 GRAM PO PWPK [25424]: 17 g | ORAL | @ 23:00:00 | NDC 00904693186

## 2022-01-15 MED ADMIN — FENTANYL CITRATE (PF) 50 MCG/ML IJ SOLN [3037]: 25 ug | INTRAVENOUS | @ 22:00:00 | Stop: 2022-01-15 | NDC 00409909412

## 2022-01-15 MED ADMIN — LACTATED RINGERS IV SOLP [4318]: 1000.000 mL | INTRAVENOUS | @ 23:00:00 | Stop: 2022-01-17 | NDC 00338011704

## 2022-01-15 MED ADMIN — LACTATED RINGERS IV SOLP [4318]: 1000.000 mL | INTRAVENOUS | @ 18:00:00 | Stop: 2022-01-17 | NDC 00338011704

## 2022-01-15 MED ADMIN — LACTATED RINGERS IV SOLP [4318]: 1000 mL | INTRAVENOUS | @ 12:00:00 | Stop: 2022-01-17 | NDC 00338011704

## 2022-01-15 MED ADMIN — SODIUM CHLORIDE 0.9 % IV SOLP [27838]: 100 mL | @ 19:00:00 | Stop: 2022-01-15 | NDC 00338951772

## 2022-01-15 MED ADMIN — HEPARIN (PORCINE) 1,000 UNIT/ML IJ SOLN [10176]: 100 mL | @ 19:00:00 | Stop: 2022-01-15 | NDC 63323054011

## 2022-01-15 MED ADMIN — OXYCODONE 5 MG PO TAB [10814]: 10 mg | ORAL | @ 23:00:00 | NDC 00904696661

## 2022-01-15 MED ADMIN — KETOROLAC 30 MG/ML (1 ML) IJ SOLN [22473]: 15 mg | INTRAVENOUS | @ 23:00:00 | Stop: 2022-01-20 | NDC 72611072201

## 2022-01-15 MED ADMIN — CEFAZOLIN INJ 1GM IVP [210319]: 1 g | INTRAVENOUS | @ 23:00:00 | Stop: 2022-01-17 | NDC 60505614200

## 2022-01-16 MED ADMIN — ANASTROZOLE 1 MG PO TAB [77198]: 1 mg | ORAL | @ 03:00:00 | NDC 59651023630

## 2022-01-16 MED ADMIN — CEFAZOLIN INJ 1GM IVP [210319]: 1 g | INTRAVENOUS | @ 23:00:00 | Stop: 2022-01-17 | NDC 60505614200

## 2022-01-16 MED ADMIN — ACETAMINOPHEN 500 MG PO TAB [102]: 1000 mg | ORAL | @ 23:00:00 | NDC 00904673061

## 2022-01-16 MED ADMIN — CEFAZOLIN INJ 1GM IVP [210319]: 1 g | INTRAVENOUS | @ 06:00:00 | Stop: 2022-01-17 | NDC 60505614200

## 2022-01-16 MED ADMIN — SENNOSIDES-DOCUSATE SODIUM 8.6-50 MG PO TAB [40926]: 1 | ORAL | @ 13:00:00 | NDC 00536124810

## 2022-01-16 MED ADMIN — ACETAMINOPHEN 500 MG PO TAB [102]: 1000 mg | ORAL | @ 06:00:00 | NDC 00904673061

## 2022-01-16 MED ADMIN — ASPIRIN 81 MG PO CHEW [680]: 81 mg | ORAL | @ 13:00:00 | NDC 66553000201

## 2022-01-16 MED ADMIN — KETOROLAC 30 MG/ML (1 ML) IJ SOLN [22473]: 15 mg | INTRAVENOUS | @ 23:00:00 | Stop: 2022-01-20 | NDC 72611072201

## 2022-01-16 MED ADMIN — ACETAMINOPHEN 500 MG PO TAB [102]: 1000 mg | ORAL | @ 18:00:00 | NDC 00904673061

## 2022-01-16 MED ADMIN — ENOXAPARIN 40 MG/0.4 ML SC SYRG [85052]: 40 mg | SUBCUTANEOUS | @ 13:00:00 | NDC 00781324602

## 2022-01-16 MED ADMIN — KETOROLAC 30 MG/ML (1 ML) IJ SOLN [22473]: 15 mg | INTRAVENOUS | @ 18:00:00 | Stop: 2022-01-20 | NDC 72611072201

## 2022-01-16 MED ADMIN — KETOROLAC 30 MG/ML (1 ML) IJ SOLN [22473]: 15 mg | INTRAVENOUS | @ 11:00:00 | Stop: 2022-01-20 | NDC 72611072201

## 2022-01-16 MED ADMIN — POLYETHYLENE GLYCOL 3350 17 GRAM PO PWPK [25424]: 17 g | ORAL | @ 13:00:00 | NDC 00904693186

## 2022-01-16 MED ADMIN — CEFAZOLIN INJ 1GM IVP [210319]: 1 g | INTRAVENOUS | @ 15:00:00 | Stop: 2022-01-17 | NDC 60505614200

## 2022-01-16 MED ADMIN — OXYCODONE 5 MG PO TAB [10814]: 5 mg | ORAL | @ 15:00:00 | NDC 68084035411

## 2022-01-16 MED ADMIN — OXYCODONE 5 MG PO TAB [10814]: 10 mg | ORAL | @ 06:00:00 | NDC 00904696661

## 2022-01-16 MED ADMIN — KETOROLAC 30 MG/ML (1 ML) IJ SOLN [22473]: 15 mg | INTRAVENOUS | @ 06:00:00 | Stop: 2022-01-20 | NDC 72611072201

## 2022-01-16 MED ADMIN — ACETAMINOPHEN 500 MG PO TAB [102]: 1000 mg | ORAL | @ 11:00:00 | NDC 00904673061

## 2022-01-16 MED ADMIN — SENNOSIDES-DOCUSATE SODIUM 8.6-50 MG PO TAB [40926]: 1 | ORAL | @ 01:00:00 | NDC 00536124810

## 2022-01-17 MED ADMIN — GABAPENTIN 100 MG PO CAP [18309]: 200 mg | ORAL | @ 02:00:00 | NDC 00904666561

## 2022-01-17 MED ADMIN — ACETAMINOPHEN 500 MG PO TAB [102]: 1000 mg | ORAL | @ 11:00:00 | NDC 00904673061

## 2022-01-17 MED ADMIN — TRAZODONE 50 MG PO TAB [8085]: 50 mg | ORAL | @ 02:00:00 | NDC 50111056001

## 2022-01-17 MED ADMIN — ACETAMINOPHEN 500 MG PO TAB [102]: 1000 mg | ORAL | @ 23:00:00 | NDC 00904673061

## 2022-01-17 MED ADMIN — POLYETHYLENE GLYCOL 3350 17 GRAM PO PWPK [25424]: 17 g | ORAL | @ 15:00:00 | NDC 00904693186

## 2022-01-17 MED ADMIN — KETOROLAC 30 MG/ML (1 ML) IJ SOLN [22473]: 15 mg | INTRAVENOUS | @ 06:00:00 | Stop: 2022-01-20 | NDC 72611072201

## 2022-01-17 MED ADMIN — GABAPENTIN 100 MG PO CAP [18309]: 200 mg | ORAL | @ 20:00:00 | NDC 00904666561

## 2022-01-17 MED ADMIN — SENNOSIDES-DOCUSATE SODIUM 8.6-50 MG PO TAB [40926]: 1 | ORAL | @ 15:00:00 | NDC 00536124810

## 2022-01-17 MED ADMIN — OXYCODONE 5 MG PO TAB [10814]: 10 mg | ORAL | @ 02:00:00 | NDC 00904696661

## 2022-01-17 MED ADMIN — CEFAZOLIN INJ 1GM IVP [210319]: 1 g | INTRAVENOUS | @ 15:00:00 | Stop: 2022-01-17 | NDC 60505614200

## 2022-01-17 MED ADMIN — KETOROLAC 30 MG/ML (1 ML) IJ SOLN [22473]: 15 mg | INTRAVENOUS | @ 11:00:00 | Stop: 2022-01-20 | NDC 72611072201

## 2022-01-17 MED ADMIN — KETOROLAC 30 MG/ML (1 ML) IJ SOLN [22473]: 15 mg | INTRAVENOUS | @ 17:00:00 | Stop: 2022-01-20 | NDC 72611072201

## 2022-01-17 MED ADMIN — ENOXAPARIN 40 MG/0.4 ML SC SYRG [85052]: 40 mg | SUBCUTANEOUS | @ 15:00:00 | NDC 00781324602

## 2022-01-17 MED ADMIN — ACETAMINOPHEN 500 MG PO TAB [102]: 1000 mg | ORAL | @ 17:00:00 | NDC 00904673061

## 2022-01-17 MED ADMIN — KETOROLAC 30 MG/ML (1 ML) IJ SOLN [22473]: 15 mg | INTRAVENOUS | @ 23:00:00 | Stop: 2022-01-20 | NDC 72611072201

## 2022-01-17 MED ADMIN — CEFAZOLIN INJ 1GM IVP [210319]: 1 g | INTRAVENOUS | @ 06:00:00 | Stop: 2022-01-17 | NDC 60505614200

## 2022-01-17 MED ADMIN — ANASTROZOLE 1 MG PO TAB [77198]: 1 mg | ORAL | @ 02:00:00 | NDC 50268007511

## 2022-01-17 MED ADMIN — ASPIRIN 81 MG PO CHEW [680]: 81 mg | ORAL | @ 15:00:00 | NDC 66553000201

## 2022-01-17 MED ADMIN — SENNOSIDES-DOCUSATE SODIUM 8.6-50 MG PO TAB [40926]: 1 | ORAL | @ 02:00:00 | NDC 00536124810

## 2022-01-17 MED ADMIN — GABAPENTIN 100 MG PO CAP [18309]: 200 mg | ORAL | @ 11:00:00 | NDC 00904666561

## 2022-01-17 MED ADMIN — ACETAMINOPHEN 500 MG PO TAB [102]: 1000 mg | ORAL | @ 06:00:00 | NDC 00904673061

## 2022-01-18 ENCOUNTER — Encounter: Admit: 2022-01-18 | Discharge: 2022-01-18 | Payer: Private Health Insurance - Indemnity | Primary: Family

## 2022-01-18 MED ADMIN — GABAPENTIN 100 MG PO CAP [18309]: 200 mg | ORAL | @ 11:00:00 | Stop: 2022-01-18 | NDC 00904666561

## 2022-01-18 MED ADMIN — KETOROLAC 30 MG/ML (1 ML) IJ SOLN [22473]: 15 mg | INTRAVENOUS | @ 05:00:00 | Stop: 2022-01-20 | NDC 72611072201

## 2022-01-18 MED ADMIN — SENNOSIDES-DOCUSATE SODIUM 8.6-50 MG PO TAB [40926]: 1 | ORAL | @ 02:00:00 | NDC 00536124810

## 2022-01-18 MED ADMIN — ACETAMINOPHEN 500 MG PO TAB [102]: 1000 mg | ORAL | @ 05:00:00 | NDC 00904673061

## 2022-01-18 MED ADMIN — ENOXAPARIN 40 MG/0.4 ML SC SYRG [85052]: 40 mg | SUBCUTANEOUS | @ 13:00:00 | Stop: 2022-01-18 | NDC 00781324602

## 2022-01-18 MED ADMIN — GABAPENTIN 100 MG PO CAP [18309]: 200 mg | ORAL | @ 02:00:00 | NDC 00904666561

## 2022-01-18 MED ADMIN — TRAZODONE 50 MG PO TAB [8085]: 50 mg | ORAL | @ 02:00:00 | NDC 50111056001

## 2022-01-18 MED ADMIN — SENNOSIDES-DOCUSATE SODIUM 8.6-50 MG PO TAB [40926]: 1 | ORAL | @ 13:00:00 | Stop: 2022-01-18 | NDC 00536124810

## 2022-01-18 MED ADMIN — ACETAMINOPHEN 500 MG PO TAB [102]: 1000 mg | ORAL | @ 11:00:00 | Stop: 2022-01-18 | NDC 00904673061

## 2022-01-18 MED ADMIN — ANASTROZOLE 1 MG PO TAB [77198]: 1 mg | ORAL | @ 02:00:00 | NDC 59651023630

## 2022-01-18 MED ADMIN — ASPIRIN 81 MG PO CHEW [680]: 81 mg | ORAL | @ 13:00:00 | Stop: 2022-01-18 | NDC 66553000201

## 2022-01-18 MED ADMIN — KETOROLAC 30 MG/ML (1 ML) IJ SOLN [22473]: 15 mg | INTRAVENOUS | @ 11:00:00 | Stop: 2022-01-18 | NDC 72611072201

## 2022-01-18 MED ADMIN — POLYETHYLENE GLYCOL 3350 17 GRAM PO PWPK [25424]: 17 g | ORAL | @ 13:00:00 | Stop: 2022-01-18 | NDC 00904693186

## 2022-01-18 MED ADMIN — OXYCODONE 5 MG PO TAB [10814]: 5 mg | ORAL | @ 10:00:00 | Stop: 2022-01-18 | NDC 00904696661

## 2022-01-18 MED FILL — OXYCODONE 5 MG PO TAB: 5 mg | ORAL | 2 days supply | Qty: 20 | Fill #1 | Status: CP

## 2022-01-18 MED FILL — GABAPENTIN 100 MG PO CAP: 100 mg | ORAL | 30 days supply | Qty: 180 | Fill #1 | Status: CP

## 2022-01-18 MED FILL — ASPIRIN 81 MG PO CHEW: 81 mg | ORAL | 30 days supply | Qty: 30 | Fill #1 | Status: CP

## 2022-01-23 ENCOUNTER — Encounter: Admit: 2022-01-23 | Discharge: 2022-01-23 | Payer: Private Health Insurance - Indemnity | Primary: Family

## 2022-01-23 ENCOUNTER — Ambulatory Visit: Admit: 2022-01-23 | Discharge: 2022-01-23 | Payer: Private Health Insurance - Indemnity | Primary: Family

## 2022-01-23 DIAGNOSIS — M1990 Unspecified osteoarthritis, unspecified site: Secondary | ICD-10-CM

## 2022-01-23 DIAGNOSIS — Z9889 Other specified postprocedural states: Secondary | ICD-10-CM

## 2022-01-23 DIAGNOSIS — K219 Gastro-esophageal reflux disease without esophagitis: Secondary | ICD-10-CM

## 2022-01-23 DIAGNOSIS — C50112 Malignant neoplasm of central portion of left female breast: Secondary | ICD-10-CM

## 2022-01-23 DIAGNOSIS — J302 Other seasonal allergic rhinitis: Secondary | ICD-10-CM

## 2022-01-23 DIAGNOSIS — G4700 Insomnia, unspecified: Secondary | ICD-10-CM

## 2022-01-23 DIAGNOSIS — C50919 Malignant neoplasm of unspecified site of unspecified female breast: Secondary | ICD-10-CM

## 2022-01-23 MED ORDER — CYCLOBENZAPRINE 5 MG PO TAB
5 mg | ORAL_TABLET | Freq: Three times a day (TID) | ORAL | 0 refills | 30.00000 days | Status: AC
Start: 2022-01-23 — End: ?

## 2022-01-23 NOTE — Progress Notes
Date of Service: 01/23/2022    Subjective:             Patricia Montes is a 56 y.o. female.   Cancer Staging   Malignant neoplasm of central portion of left breast in female, estrogen receptor positive   Staging form: Breast, AJCC 8th Edition  - Clinical stage from 06/15/2018: Stage IA (cT1c, cN0, cM0, G3, ER+, PR+, HER2+) - Signed by Andrew Au, PA-C on 07/13/2018  - Pathologic: No Stage Recommended (ypT2, pN1, cM0, G3, ER+, PR+, HER2+) - Signed by Neldon Mc, DO on 12/17/2018    History of Present Illness  Patient returns to clinic postoperatively. She is overall doing well and notes her pain has been tolerable. She notes firmness of left lateral breast that is tender to palpation. She has firmness and tenderness inferior to bilateral thigh incisions. Thigh JP drains ready for removal.     Allergies   Allergen Reactions   ? Triple Antibiotic [Neomy-Bacit-Polymyx-Pramoxine] SEE COMMENTS     Petroleum based products result in staph infection  Pt has issues with Vaseline and blistex as well   ? Petroleum Distillates SEE COMMENTS     Petroleum based products result in staph infections per pt    ? Seasonal Allergies RHINITIS and COUGH     Sinus drainage         Review of Systems   All other systems reviewed and are negative.    Medical History:   Diagnosis Date   ? Breast cancer (HCC) 06/15/2018    left IDC   ? GERD (gastroesophageal reflux disease)    ? Insomnia    ? Osteoarthritis    ? Seasonal allergies      Surgical History:   Procedure Laterality Date   ? CLAVICLE SURGERY Left 1991   ? HX BREAST REDUCTION Bilateral 2002    with abdominoplasty   ? HX HYSTERECTOMY  2012   ? PLACEMENT OF PORT-A-CATH 8 FRENCH SINGLE-LUMEN POWERPORT Right 07/15/2018    Performed by Dellia Cloud, DO at [facility] OR   ? FLUOROSCOPIC GUIDANCE CENTRAL VENOUS ACCESS DEVICE PLACEMENT Right 07/15/2018    Performed by Dellia Cloud, DO at [facility] OR   ? TUNNELED VENOUS PORT PLACEMENT Left 07/22/2018   ? LEFT SKIN SPARING MASTECTOMY Left 12/09/2018    Performed by Neldon Mc, DO at [facility] OR   ? IDENTIFICATION SENTINEL LYMPH NODE Left 12/09/2018    Performed by Neldon Mc, DO at [facility] OR   ? INJECTION RADIOACTIVE TRACER FOR SENTINEL NODE IDENTIFICATION Left 12/09/2018    Performed by Neldon Mc, DO at [facility] OR   ? LEFT SENTINEL LYMPH NODE BIOPSY Left 12/09/2018    Performed by Neldon Mc, DO at [facility] OR   ? RECONSTRUCTION BREAST WITH SIENTRA LPP-FH15S 600-720 FILLED TO 350 CC TISSUE EXPANDER- IMMEDIATE Left 12/09/2018    Performed by Corliss Skains, MD at [facility] OR   ? IMPLANTATION ALLODERM EXTRA LARGE PERFORATED CONTOUR BIOLOGIC IMPLANT FOR SOFT TISSUE REINFORCEMENT Left 12/09/2018    Performed by Corliss Skains, MD at [facility] OR   ? INTRAVENOUS INJECTION AGENT TO TEST VASCULAR FLOW IN FLAP/ GRAFT Left 12/09/2018    Performed by Corliss Skains, MD at [facility] OR   ? REPLACEMENT TISSUE EXPANDER WITH PERMANENT PROSTHESIS-Left breast tissue expander removal, capsulotomy, placement implant and right balancing mastopexy and fat grafting from abdomen to breasts Left 01/03/2020    Performed by Corliss Skains, MD at [facility]  OR   ? REVISION PERI-IMPLANTCAPSULE BREAST WITH CAPSULOTOMY/ CAPSULORRHAPHY/ PARTIAL CAPSULECTOMY Left 01/03/2020    Performed by Corliss Skains, MD at [facility] OR   ? MASTOPEXY Right 01/03/2020    Performed by Corliss Skains, MD at [facility] OR   ? GRAFTING AUTOLOGOUS FAT BY LIPOSUCTION TO TRUNK/ BREASTS/ SCALP/ ARMS/ LEGS - 50 CC OR LESS Bilateral 01/03/2020    Performed by Corliss Skains, MD at [facility] OR   ? GRAFTING AUTOLOGOUS FAT BY LIPOSUCTION TO TRUNK/ BREASTS/ SCALP/ ARMS/ LEGS - EACH ADDITIONAL 50 CC Bilateral 01/03/2020    Performed by Corliss Skains, MD at [facility] OR   ? REMOVAL TUNNELED CENTRAL VENOUS ACCESS DEVICE INCLUDING PORT/ PUMP Right 01/03/2020    Performed by Corliss Skains, MD at [facility] OR   ? RECONSTRUCTION BREAST WITH FREE FLAP-STACKED PAP FLAPS FOR LEFT BREAST RECONSTRUCTION Left 01/15/2022    Performed by Reyne Dumas, MD at [facility] OR   ? INTRAVENOUS INJECTION AGENT TO TEST VASCULAR FLOW IN FLAP/ GRAFT Left 01/15/2022    Performed by Reyne Dumas, MD at [facility] OR   ? PERI-IMPLANT CAPSULECTOMY BREAST - COMPLETE, INCLUDING REMOVAL ALL INTRACAPSULAR CONTENTS Left 01/15/2022    Performed by Reyne Dumas, MD at [facility] OR   ? HX COSMETIC SURGERY  2001    Breast reduction-   ? HX TUBAL LIGATION  2001     Family History   Problem Relation Age of Onset   ? Unknown to Patient Mother    ? Heart Disease Father    ? High Cholesterol Father    ? Seizures Brother    ? Arthritis-rheumatoid Maternal Aunt    ? Cancer Maternal Uncle         non Hodgkins   ? Arthritis-rheumatoid Maternal Grandmother    ? Cancer-Breast Other 82        mat great gma   ? Heart Disease Other    ? Arthritis-rheumatoid Other    ? High Cholesterol Other      Social History     Socioeconomic History   ? Marital status: Married   Tobacco Use   ? Smoking status: Never     Passive exposure: Current   ? Smokeless tobacco: Never   ? Tobacco comments:     I have nevwr smoked but my husband does!   Substance and Sexual Activity   ? Alcohol use: Never   ? Drug use: Never        Vaping/E-liquid Substances   ? CBD No    ? Nicotine No    ? THC No                Objective:         ? albuterol sulfate (PROAIR HFA) 90 mcg/actuation HFA aerosol inhaler Inhale two puffs by mouth into the lungs every 6 hours as needed for Wheezing or Shortness of Breath.   ? anastrozole (ARIMIDEX) 1 mg tablet Take one tablet by mouth at bedtime daily.   ? aspirin 81 mg chewable tablet Chew one tablet by mouth daily for 30 days.   ? calcium carbonate (OS-CAL) 1250 mg (500 mg elemental calcium) tablet Take one tablet by mouth at bedtime daily.   ? CHOLEcalciferoL (vitamin D3) (DIALYVITE VITAMIN D) 125 mcg (5,000 unit) capsule Take one capsule by mouth at bedtime daily.   ? fluticasone propionate (FLONASE) 50 mcg/actuation nasal spray, suspension Apply one spray to each nostril as directed daily as needed. Indications:  inflammation of the nose due to an allergy   ? gabapentin (NEURONTIN) 100 mg capsule Take two capsules by mouth every 8 hours.   ? krill-om-3-dha-epa-phospho-ast (MAXIMUM RED KRILL OMEGA-3) 300-90-27-45 mg cap Take 1 capsule by mouth at bedtime daily.   ? lisinopriL (ZESTRIL) 5 mg tablet Take one tablet by mouth at bedtime daily.   ? magnesium citrate 200 mg tab Take two tablets by mouth at bedtime daily.   ? multivit-min-iron-FA-lutein (CENTRUM SILVER WOMEN) 8 mg iron-400 mcg-300 mcg tab Take 1 tablet by mouth at bedtime daily.   ? oxyCODONE (ROXICODONE) 5 mg tablet Take one tablet to two tablets by mouth every 4 hours as needed.   ? pantoprazole DR (PROTONIX) 20 mg tablet Take one tablet by mouth at bedtime as needed. Indications: heartburn   ? traZODone (DESYREL) 50 mg tablet Take one tablet by mouth at bedtime daily.     Vitals:    01/23/22 0958   BP: 135/69   Pulse: 91   Weight: 78.9 kg (174 lb)   Height: 165.1 cm (5' 5)     Body mass index is 28.96 kg/m?Marland Kitchen     Physical Exam  Vitals reviewed.   Constitutional:       Appearance: Normal appearance. She is well-groomed.      Comments: Bilateral medial thigh incisions healing appropriately.    HENT:      Head: Normocephalic.   Pulmonary:      Effort: Pulmonary effort is normal.   Chest:      Comments: Left breast PAP flaps healing appropriately - palpable firmness of lateral chest.  Skin:     General: Skin is warm and dry.   Neurological:      Mental Status: She is alert.   Psychiatric:         Mood and Affect: Mood normal.         Behavior: Behavior normal. Behavior is cooperative.         Thought Content: Thought content normal.         Judgment: Judgment normal.          Assessment and Plan:  Patient s/p left stacked PAP breast flaps.  Thigh drains removed.  Redressed left breast JP.  Replaced paper tape over thigh incisions.  Continue lifting and bathing restrictions until week 4 postop.     RTC Monday.

## 2022-01-28 ENCOUNTER — Encounter: Admit: 2022-01-28 | Discharge: 2022-01-28 | Payer: Private Health Insurance - Indemnity | Primary: Family

## 2022-01-28 ENCOUNTER — Ambulatory Visit: Admit: 2022-01-28 | Discharge: 2022-01-29 | Payer: Private Health Insurance - Indemnity | Primary: Family

## 2022-01-28 ENCOUNTER — Ambulatory Visit: Admit: 2022-01-28 | Discharge: 2022-01-28 | Payer: Private Health Insurance - Indemnity | Primary: Family

## 2022-01-28 DIAGNOSIS — Z853 Personal history of malignant neoplasm of breast: Secondary | ICD-10-CM

## 2022-01-28 DIAGNOSIS — G4700 Insomnia, unspecified: Secondary | ICD-10-CM

## 2022-01-28 DIAGNOSIS — J302 Other seasonal allergic rhinitis: Secondary | ICD-10-CM

## 2022-01-28 DIAGNOSIS — Z9889 Other specified postprocedural states: Secondary | ICD-10-CM

## 2022-01-28 DIAGNOSIS — K219 Gastro-esophageal reflux disease without esophagitis: Secondary | ICD-10-CM

## 2022-01-28 DIAGNOSIS — M1990 Unspecified osteoarthritis, unspecified site: Secondary | ICD-10-CM

## 2022-01-28 DIAGNOSIS — Z9012 Acquired absence of left breast and nipple: Secondary | ICD-10-CM

## 2022-01-28 DIAGNOSIS — C50919 Malignant neoplasm of unspecified site of unspecified female breast: Secondary | ICD-10-CM

## 2022-01-28 NOTE — Progress Notes
Subjective:       History of Present Illness  Patricia Montes is a 56 y.o. female 2 weeks status post stacked PAP flaps for left breast reconstruction. She feels well overall and her drain output has decreased significantly. She reports lateral tenderness and a hard bump over the superior lateral aspect of her flap. She has been wearing a compressive garment around her legs.        Review of Systems   Constitutional: Negative.    HENT: Negative.    Eyes: Negative.    Respiratory: Negative.    Cardiovascular: Negative.    Gastrointestinal: Negative.    Endocrine: Negative.    Genitourinary: Negative.    Musculoskeletal: Positive for back pain.   Skin: Negative.    Allergic/Immunologic: Negative.    Neurological: Negative.    Hematological: Negative.    Psychiatric/Behavioral: Negative.          Objective:         ??? albuterol sulfate (PROAIR HFA) 90 mcg/actuation HFA aerosol inhaler Inhale two puffs by mouth into the lungs every 6 hours as needed for Wheezing or Shortness of Breath.   ??? anastrozole (ARIMIDEX) 1 mg tablet Take one tablet by mouth at bedtime daily.   ??? aspirin 81 mg chewable tablet Chew one tablet by mouth daily for 30 days.   ??? calcium carbonate (OS-CAL) 1250 mg (500 mg elemental calcium) tablet Take one tablet by mouth at bedtime daily.   ??? CHOLEcalciferoL (vitamin D3) (DIALYVITE VITAMIN D) 125 mcg (5,000 unit) capsule Take one capsule by mouth at bedtime daily.   ??? cyclobenzaprine (FLEXERIL) 5 mg tablet Take one tablet by mouth three times daily.   ??? fluticasone propionate (FLONASE) 50 mcg/actuation nasal spray, suspension Apply one spray to each nostril as directed daily as needed. Indications: inflammation of the nose due to an allergy   ??? gabapentin (NEURONTIN) 100 mg capsule Take two capsules by mouth every 8 hours.   ??? krill-om-3-dha-epa-phospho-ast (MAXIMUM RED KRILL OMEGA-3) 300-90-27-45 mg cap Take 1 capsule by mouth at bedtime daily.   ??? lisinopriL (ZESTRIL) 5 mg tablet Take one tablet by mouth at bedtime daily.   ??? magnesium citrate 200 mg tab Take two tablets by mouth at bedtime daily.   ??? multivit-min-iron-FA-lutein (CENTRUM SILVER WOMEN) 8 mg iron-400 mcg-300 mcg tab Take 1 tablet by mouth at bedtime daily.   ??? pantoprazole DR (PROTONIX) 20 mg tablet Take one tablet by mouth at bedtime as needed. Indications: heartburn   ??? traZODone (DESYREL) 50 mg tablet Take one tablet by mouth at bedtime daily.     Vitals:    01/28/22 0939   PainSc: Four     There is no height or weight on file to calculate BMI.     Physical Exam  Constitutional:       Appearance: Normal appearance.   Cardiovascular:      Rate and Rhythm: Normal rate and regular rhythm.      Pulses: Normal pulses.      Heart sounds: Normal heart sounds.   Pulmonary:      Effort: Pulmonary effort is normal.      Breath sounds: Normal breath sounds.   Abdominal:      General: Abdomen is flat.      Palpations: Abdomen is soft.      Comments: Left chest flap with appropriate color. Incisions healing well. Lateral breast fullness, hard to palpation.    Musculoskeletal:      Comments: Bilateral thigh  incisions healing well without areas of dehiscence, no erythema.    Skin:     General: Skin is warm and dry.   Neurological:      General: No focal deficit present.      Mental Status: She is alert and oriented to person, place, and time.              Assessment and Plan:  Patricia Montes is a 56 y.o. female 2 weeks status post stacked PAP flaps for left breast reconstruction.    - Ultrasound to further examine left lateral flap fullness/firmness to rule out hematoma versus partial flap necrosis

## 2022-01-29 ENCOUNTER — Ambulatory Visit: Admit: 2022-01-29 | Discharge: 2022-01-29 | Payer: Private Health Insurance - Indemnity | Primary: Family

## 2022-01-29 ENCOUNTER — Encounter: Admit: 2022-01-29 | Discharge: 2022-01-29 | Payer: Private Health Insurance - Indemnity | Primary: Family

## 2022-01-29 DIAGNOSIS — Z9889 Other specified postprocedural states: Secondary | ICD-10-CM

## 2022-01-29 DIAGNOSIS — Z853 Personal history of malignant neoplasm of breast: Secondary | ICD-10-CM

## 2022-01-30 ENCOUNTER — Encounter: Admit: 2022-01-30 | Discharge: 2022-01-30 | Payer: Private Health Insurance - Indemnity | Primary: Family

## 2022-02-01 ENCOUNTER — Encounter: Admit: 2022-02-01 | Discharge: 2022-02-01 | Payer: Private Health Insurance - Indemnity | Primary: Family

## 2022-02-01 ENCOUNTER — Ambulatory Visit: Admit: 2022-02-01 | Discharge: 2022-02-01 | Payer: Private Health Insurance - Indemnity | Primary: Family

## 2022-02-01 DIAGNOSIS — G4700 Insomnia, unspecified: Secondary | ICD-10-CM

## 2022-02-01 DIAGNOSIS — J302 Other seasonal allergic rhinitis: Secondary | ICD-10-CM

## 2022-02-01 DIAGNOSIS — K219 Gastro-esophageal reflux disease without esophagitis: Secondary | ICD-10-CM

## 2022-02-01 DIAGNOSIS — M1990 Unspecified osteoarthritis, unspecified site: Secondary | ICD-10-CM

## 2022-02-01 DIAGNOSIS — C50919 Malignant neoplasm of unspecified site of unspecified female breast: Secondary | ICD-10-CM

## 2022-02-01 MED ORDER — MIDAZOLAM 1 MG/ML IJ SOLN
INTRAVENOUS | 0 refills | Status: DC
Start: 2022-02-01 — End: 2022-02-01

## 2022-02-01 MED ORDER — LIDOCAINE (PF) 200 MG/10 ML (2 %) IJ SYRG
INTRAVENOUS | 0 refills | Status: DC
Start: 2022-02-01 — End: 2022-02-01

## 2022-02-01 MED ORDER — ONDANSETRON HCL (PF) 4 MG/2 ML IJ SOLN
INTRAVENOUS | 0 refills | Status: DC
Start: 2022-02-01 — End: 2022-02-01

## 2022-02-01 MED ORDER — PROPOFOL 10 MG/ML IV EMUL 100 ML (INFUSION)(AM)(OR)
INTRAVENOUS | 0 refills | Status: DC
Start: 2022-02-01 — End: 2022-02-01
  Administered 2022-02-01: 22:00:00 50 ug/kg/min via INTRAVENOUS

## 2022-02-01 MED ORDER — ARTIFICIAL TEARS (PF) SINGLE DOSE DROPS GROUP
OPHTHALMIC | 0 refills | Status: DC
Start: 2022-02-01 — End: 2022-02-01

## 2022-02-01 MED ORDER — KETOROLAC 30 MG/ML (1 ML) IJ SOLN
INTRAVENOUS | 0 refills | Status: DC
Start: 2022-02-01 — End: 2022-02-01

## 2022-02-01 MED ORDER — DEXAMETHASONE SODIUM PHOSPHATE 4 MG/ML IJ SOLN
INTRAVENOUS | 0 refills | Status: DC
Start: 2022-02-01 — End: 2022-02-01

## 2022-02-01 MED ORDER — PROPOFOL INJ 10 MG/ML IV VIAL
INTRAVENOUS | 0 refills | Status: DC
Start: 2022-02-01 — End: 2022-02-01

## 2022-02-01 MED ORDER — FENTANYL CITRATE (PF) 50 MCG/ML IJ SOLN
INTRAVENOUS | 0 refills | Status: DC
Start: 2022-02-01 — End: 2022-02-01

## 2022-02-01 MED ORDER — PHENYLEPHRINE HCL IN 0.9% NACL 1 MG/10 ML (100 MCG/ML) IV SYRG
INTRAVENOUS | 0 refills | Status: DC
Start: 2022-02-01 — End: 2022-02-01

## 2022-02-01 MED ORDER — CEFAZOLIN 1 GRAM IJ SOLR
INTRAVENOUS | 0 refills | Status: DC
Start: 2022-02-01 — End: 2022-02-01

## 2022-02-01 MED ADMIN — FENTANYL CITRATE (PF) 50 MCG/ML IJ SOLN [3037]: 25 ug | INTRAVENOUS | @ 23:00:00 | Stop: 2022-02-02 | NDC 00641602701

## 2022-02-01 MED ADMIN — LACTATED RINGERS IV SOLP [4318]: 1000.000 mL | INTRAVENOUS | @ 22:00:00 | Stop: 2022-02-02 | NDC 00338011704

## 2022-02-01 MED ADMIN — OXYCODONE 5 MG PO TAB [10814]: 5 mg | ORAL | @ 23:00:00 | Stop: 2022-02-01 | NDC 68084035411

## 2022-02-01 MED FILL — HYDROCODONE-ACETAMINOPHEN 5-325 MG PO TAB: 5/325 mg | ORAL | 3 days supply | Qty: 12 | Fill #1 | Status: CP

## 2022-02-03 ENCOUNTER — Encounter: Admit: 2022-02-03 | Discharge: 2022-02-03 | Payer: Private Health Insurance - Indemnity | Primary: Family

## 2022-02-03 DIAGNOSIS — M1990 Unspecified osteoarthritis, unspecified site: Secondary | ICD-10-CM

## 2022-02-03 DIAGNOSIS — G4700 Insomnia, unspecified: Secondary | ICD-10-CM

## 2022-02-03 DIAGNOSIS — C50919 Malignant neoplasm of unspecified site of unspecified female breast: Secondary | ICD-10-CM

## 2022-02-03 DIAGNOSIS — K219 Gastro-esophageal reflux disease without esophagitis: Secondary | ICD-10-CM

## 2022-02-03 DIAGNOSIS — J302 Other seasonal allergic rhinitis: Secondary | ICD-10-CM

## 2022-02-04 ENCOUNTER — Encounter: Admit: 2022-02-04 | Discharge: 2022-02-04 | Payer: Private Health Insurance - Indemnity | Primary: Family

## 2022-02-04 ENCOUNTER — Ambulatory Visit: Admit: 2022-02-04 | Discharge: 2022-02-05 | Payer: Private Health Insurance - Indemnity | Primary: Family

## 2022-02-04 DIAGNOSIS — J302 Other seasonal allergic rhinitis: Secondary | ICD-10-CM

## 2022-02-04 DIAGNOSIS — G4700 Insomnia, unspecified: Secondary | ICD-10-CM

## 2022-02-04 DIAGNOSIS — C50919 Malignant neoplasm of unspecified site of unspecified female breast: Secondary | ICD-10-CM

## 2022-02-04 DIAGNOSIS — K219 Gastro-esophageal reflux disease without esophagitis: Secondary | ICD-10-CM

## 2022-02-04 DIAGNOSIS — C50112 Malignant neoplasm of central portion of left female breast: Secondary | ICD-10-CM

## 2022-02-04 DIAGNOSIS — Z9889 Other specified postprocedural states: Secondary | ICD-10-CM

## 2022-02-04 DIAGNOSIS — M1990 Unspecified osteoarthritis, unspecified site: Secondary | ICD-10-CM

## 2022-02-04 MED ORDER — SILVER SULFADIAZINE 1 % TP CREA
1 g | Freq: Every day | TOPICAL | 0 refills | 10.00000 days | Status: AC
Start: 2022-02-04 — End: ?

## 2022-02-04 NOTE — Progress Notes
Date of Service: 02/04/2022    Subjective:             Patricia Montes is a 56 y.o. female.   Cancer Staging   Malignant neoplasm of central portion of left breast in female, estrogen receptor positive   Staging form: Breast, AJCC 8th Edition  - Clinical stage from 06/15/2018: Stage IA (cT1c, cN0, cM0, G3, ER+, PR+, HER2+) - Signed by Andrew Au, PA-C on 07/13/2018  - Pathologic: No Stage Recommended (ypT2, pN1, cM0, G3, ER+, PR+, HER2+) - Signed by Neldon Mc, DO on 12/17/2018    History of Present Illness  Patient returns to clinic postoperatively. She is overall doing well and notes her pain has been tolerable. She notes that her left breast JP drain has an output less than 20cc daily.  Allergies   Allergen Reactions   ? Bactrim [Sulfamethoxazole-Trimethoprim] HIVES   ? Triple Antibiotic [Neomy-Bacit-Polymyx-Pramoxine] SEE COMMENTS     Petroleum based products result in staph infection  Pt has issues with Vaseline and blistex as well   ? Petroleum Distillates SEE COMMENTS     Petroleum based products result in staph infections per pt         Review of Systems   Constitutional: Negative.    HENT: Negative.    Eyes: Negative.    Respiratory: Negative.    Cardiovascular: Negative.    Gastrointestinal: Negative.    Endocrine: Negative.    Genitourinary: Negative.    Musculoskeletal: Negative.    Skin: Negative.    Allergic/Immunologic: Negative.    Neurological: Negative.    Hematological: Negative.    Psychiatric/Behavioral: Negative.    All other systems reviewed and are negative.    Medical History:   Diagnosis Date   ? Breast cancer (HCC) 06/15/2018    left IDC   ? GERD (gastroesophageal reflux disease)    ? Insomnia    ? Osteoarthritis    ? Seasonal allergies      Surgical History:   Procedure Laterality Date   ? CLAVICLE SURGERY Left 1991   ? HX BREAST REDUCTION Bilateral 2002    with abdominoplasty   ? HX HYSTERECTOMY  2012   ? PLACEMENT OF PORT-A-CATH 8 FRENCH SINGLE-LUMEN POWERPORT Right 07/15/2018    Performed by Dellia Cloud, DO at [facility] OR   ? FLUOROSCOPIC GUIDANCE CENTRAL VENOUS ACCESS DEVICE PLACEMENT Right 07/15/2018    Performed by Dellia Cloud, DO at [facility] OR   ? TUNNELED VENOUS PORT PLACEMENT Left 07/22/2018   ? LEFT SKIN SPARING MASTECTOMY Left 12/09/2018    Performed by Neldon Mc, DO at [facility] OR   ? IDENTIFICATION SENTINEL LYMPH NODE Left 12/09/2018    Performed by Neldon Mc, DO at [facility] OR   ? INJECTION RADIOACTIVE TRACER FOR SENTINEL NODE IDENTIFICATION Left 12/09/2018    Performed by Neldon Mc, DO at [facility] OR   ? LEFT SENTINEL LYMPH NODE BIOPSY Left 12/09/2018    Performed by Neldon Mc, DO at [facility] OR   ? RECONSTRUCTION BREAST WITH SIENTRA LPP-FH15S 600-720 FILLED TO 350 CC TISSUE EXPANDER- IMMEDIATE Left 12/09/2018    Performed by Corliss Skains, MD at [facility] OR   ? IMPLANTATION ALLODERM EXTRA LARGE PERFORATED CONTOUR BIOLOGIC IMPLANT FOR SOFT TISSUE REINFORCEMENT Left 12/09/2018    Performed by Corliss Skains, MD at [facility] OR   ? INTRAVENOUS INJECTION AGENT TO TEST VASCULAR FLOW IN FLAP/ GRAFT Left 12/09/2018    Performed by Kai Levins  A, MD at [facility] OR   ? REPLACEMENT TISSUE EXPANDER WITH PERMANENT PROSTHESIS-Left breast tissue expander removal, capsulotomy, placement implant and right balancing mastopexy and fat grafting from abdomen to breasts Left 01/03/2020    Performed by Corliss Skains, MD at [facility] OR   ? REVISION PERI-IMPLANTCAPSULE BREAST WITH CAPSULOTOMY/ CAPSULORRHAPHY/ PARTIAL CAPSULECTOMY Left 01/03/2020    Performed by Corliss Skains, MD at [facility] OR   ? MASTOPEXY Right 01/03/2020    Performed by Corliss Skains, MD at [facility] OR   ? GRAFTING AUTOLOGOUS FAT BY LIPOSUCTION TO TRUNK/ BREASTS/ SCALP/ ARMS/ LEGS - 50 CC OR LESS Bilateral 01/03/2020    Performed by Corliss Skains, MD at [facility] OR   ? GRAFTING AUTOLOGOUS FAT BY LIPOSUCTION TO TRUNK/ BREASTS/ SCALP/ ARMS/ LEGS - EACH ADDITIONAL 50 CC Bilateral 01/03/2020 Performed by Corliss Skains, MD at [facility] OR   ? REMOVAL TUNNELED CENTRAL VENOUS ACCESS DEVICE INCLUDING PORT/ PUMP Right 01/03/2020    Performed by Corliss Skains, MD at [facility] OR   ? RECONSTRUCTION BREAST WITH FREE FLAP-STACKED PAP FLAPS FOR LEFT BREAST RECONSTRUCTION Left 01/15/2022    Performed by Reyne Dumas, MD at [facility] OR   ? INTRAVENOUS INJECTION AGENT TO TEST VASCULAR FLOW IN FLAP/ GRAFT Left 01/15/2022    Performed by Reyne Dumas, MD at [facility] OR   ? PERI-IMPLANT CAPSULECTOMY BREAST - COMPLETE, INCLUDING REMOVAL ALL INTRACAPSULAR CONTENTS Left 01/15/2022    Performed by Reyne Dumas, MD at [facility] OR   ? INCISION AND DRAINAGE HEMATOMA/ SEROMA - LEFT BREAST FLAP Left 02/01/2022    Performed by Reyne Dumas, MD at [facility] OR   ? HX COSMETIC SURGERY  2001    Breast reduction-   ? HX TUBAL LIGATION  2001     Family History   Problem Relation Age of Onset   ? Unknown to Patient Mother    ? Heart Disease Father    ? High Cholesterol Father    ? Seizures Brother    ? Arthritis-rheumatoid Maternal Aunt    ? Cancer Maternal Uncle         non Hodgkins   ? Arthritis-rheumatoid Maternal Grandmother    ? Cancer-Breast Other 82        mat great gma   ? Heart Disease Other    ? Arthritis-rheumatoid Other    ? High Cholesterol Other      Social History     Socioeconomic History   ? Marital status: Married   Tobacco Use   ? Smoking status: Never     Passive exposure: Current   ? Smokeless tobacco: Never   ? Tobacco comments:     I have nevwr smoked but my husband does!   Substance and Sexual Activity   ? Alcohol use: Never   ? Drug use: Never        Vaping/E-liquid Substances   ? CBD No    ? Nicotine No    ? THC No                Objective:         ? albuterol sulfate (PROAIR HFA) 90 mcg/actuation HFA aerosol inhaler Inhale two puffs by mouth into the lungs every 6 hours as needed for Wheezing or Shortness of Breath.   ? anastrozole (ARIMIDEX) 1 mg tablet Take one tablet by mouth at bedtime daily.   ? aspirin 81 mg chewable tablet Chew one tablet by mouth daily for  30 days.   ? calcium carbonate (OS-CAL) 1250 mg (500 mg elemental calcium) tablet Take one tablet by mouth at bedtime daily.   ? CHOLEcalciferoL (vitamin D3) (DIALYVITE VITAMIN D) 125 mcg (5,000 unit) capsule Take one capsule by mouth at bedtime daily.   ? cyclobenzaprine (FLEXERIL) 5 mg tablet Take one tablet by mouth three times daily.   ? fluticasone propionate (FLONASE) 50 mcg/actuation nasal spray, suspension Apply one spray to each nostril as directed daily as needed. Indications: inflammation of the nose due to an allergy   ? gabapentin (NEURONTIN) 100 mg capsule Take two capsules by mouth every 8 hours. (Patient taking differently: Take two capsules by mouth twice daily.)   ? HYDROcodone/acetaminophen (NORCO) 5/325 mg tablet Take one tablet by mouth every 6 hours as needed for Pain.   ? krill-om-3-dha-epa-phospho-ast (MAXIMUM RED KRILL OMEGA-3) 300-90-27-45 mg cap Take 1 capsule by mouth at bedtime daily.   ? lisinopriL (ZESTRIL) 5 mg tablet Take one tablet by mouth at bedtime daily.   ? magnesium citrate 200 mg tab Take two tablets by mouth at bedtime daily.   ? multivit-min-iron-FA-lutein (CENTRUM SILVER WOMEN) 8 mg iron-400 mcg-300 mcg tab Take 1 tablet by mouth at bedtime daily.   ? pantoprazole DR (PROTONIX) 20 mg tablet Take one tablet by mouth at bedtime as needed. Indications: heartburn   ? traZODone (DESYREL) 50 mg tablet Take one tablet by mouth at bedtime daily.     Vitals:    02/04/22 0839   BP: (!) 163/80   BP Source: Arm, Right Upper   Pulse: 84   Temp: 36.9 ?C (98.4 ?F)   SpO2: 98%   TempSrc: Temporal   PainSc: Zero   Weight: 81.6 kg (180 lb)   Height: 165.1 cm (5' 5)     Body mass index is 29.95 kg/m?Marland Kitchen     Physical Exam  Vitals reviewed.   Constitutional:       Appearance: Normal appearance. She is well-groomed.       HENT:      Head: Normocephalic.   Pulmonary:      Effort: Pulmonary effort is normal.   Chest: Comments: Left breast DIEP incision healing appropriately.   Skin:     General: Skin is warm and dry.   Neurological:      Mental Status: She is alert.   Psychiatric:         Mood and Affect: Mood normal.         Behavior: Behavior normal. Behavior is cooperative.         Thought Content: Thought content normal.         Judgment: Judgment normal.          Assessment and Plan:  Patient s/p left stacked PAP flap with left breast hematoma/seroma evacuation.  Left JP removed.  Silvadene, therabond and tegaderm placed over left thigh dehiscence.    RTC in 2 weeks.

## 2022-02-18 ENCOUNTER — Encounter: Admit: 2022-02-18 | Discharge: 2022-02-18 | Payer: Private Health Insurance - Indemnity | Primary: Family

## 2022-02-18 ENCOUNTER — Ambulatory Visit: Admit: 2022-02-18 | Discharge: 2022-02-19 | Payer: Private Health Insurance - Indemnity | Primary: Family

## 2022-02-18 DIAGNOSIS — Z9889 Other specified postprocedural states: Secondary | ICD-10-CM

## 2022-02-18 DIAGNOSIS — N650 Deformity of reconstructed breast: Secondary | ICD-10-CM

## 2022-02-18 DIAGNOSIS — G4700 Insomnia, unspecified: Secondary | ICD-10-CM

## 2022-02-18 DIAGNOSIS — J302 Other seasonal allergic rhinitis: Secondary | ICD-10-CM

## 2022-02-18 DIAGNOSIS — K219 Gastro-esophageal reflux disease without esophagitis: Secondary | ICD-10-CM

## 2022-02-18 DIAGNOSIS — C50919 Malignant neoplasm of unspecified site of unspecified female breast: Secondary | ICD-10-CM

## 2022-02-18 DIAGNOSIS — M1990 Unspecified osteoarthritis, unspecified site: Secondary | ICD-10-CM

## 2022-02-18 DIAGNOSIS — C50112 Malignant neoplasm of central portion of left female breast: Secondary | ICD-10-CM

## 2022-02-18 MED ORDER — SANTYL 250 UNIT/GRAM TP OINT
1 g | Freq: Every day | TOPICAL | 1 refills | Status: CN
Start: 2022-02-18 — End: ?

## 2022-02-18 MED ORDER — SANTYL 250 UNIT/GRAM TP OINT
1 g | Freq: Every day | TOPICAL | 1 refills | Status: DC
Start: 2022-02-18 — End: 2022-02-18
  Filled 2022-02-18: 30d supply
  Filled 2022-02-18: qty 30, 30d supply, fill #1

## 2022-02-18 MED ORDER — SANTYL 250 UNIT/GRAM TP OINT
1 g | Freq: Every day | TOPICAL | 1 refills | Status: DC
Start: 2022-02-18 — End: 2022-02-18

## 2022-02-18 NOTE — Progress Notes
Date of Service: 02/18/2022    Subjective:             Patricia Montes is a 56 y.o. female.   Cancer Staging   Malignant neoplasm of central portion of left breast in female, estrogen receptor positive   Staging form: Breast, AJCC 8th Edition  - Clinical stage from 06/15/2018: Stage IA (cT1c, cN0, cM0, G3, ER+, PR+, HER2+) - Signed by Andrew Au, PA-C on 07/13/2018  - Pathologic: No Stage Recommended (ypT2, pN1, cM0, G3, ER+, PR+, HER2+) - Signed by Neldon Mc, DO on 12/17/2018    History of Present Illness  Patient returns to clinic postoperatively. She has been applying silvadene to her left posterior thigh - PAP donor site. She notes discomfort with sitting and feels that her wound healing has stalled.    Allergies   Allergen Reactions   ? Bactrim [Sulfamethoxazole-Trimethoprim] HIVES   ? Triple Antibiotic [Neomy-Bacit-Polymyx-Pramoxine] SEE COMMENTS     Petroleum based products result in staph infection  Pt has issues with Vaseline and blistex as well   ? Petroleum Distillates SEE COMMENTS     Petroleum based products result in staph infections per pt          Review of Systems   Constitutional: Negative.    HENT: Negative.    Eyes: Negative.    Respiratory: Negative.    Cardiovascular: Negative.    Gastrointestinal: Negative.    Endocrine: Negative.    Genitourinary: Negative.    Musculoskeletal: Negative.    Skin: Positive for wound.   Allergic/Immunologic: Negative.    Neurological: Negative.    Hematological: Negative.    Psychiatric/Behavioral: Negative.    All other systems reviewed and are negative.    Medical History:   Diagnosis Date   ? Breast cancer (HCC) 06/15/2018    left IDC   ? GERD (gastroesophageal reflux disease)    ? Insomnia    ? Osteoarthritis    ? Seasonal allergies      Surgical History:   Procedure Laterality Date   ? CLAVICLE SURGERY Left 1991   ? HX BREAST REDUCTION Bilateral 2002    with abdominoplasty   ? HX HYSTERECTOMY  2012   ? PLACEMENT OF PORT-A-CATH 8 FRENCH SINGLE-LUMEN POWERPORT Right 07/15/2018    Performed by Dellia Cloud, DO at [facility]   ? FLUOROSCOPIC GUIDANCE CENTRAL VENOUS ACCESS DEVICE PLACEMENT Right 07/15/2018    Performed by Dellia Cloud, DO at [facility] OR   ? TUNNELED VENOUS PORT PLACEMENT Left 07/22/2018   ? LEFT SKIN SPARING MASTECTOMY Left 12/09/2018    Performed by Neldon Mc, DO at [facility]   ? IDENTIFICATION SENTINEL LYMPH NODE Left 12/09/2018    Performed by Neldon Mc, DO at [facility]   ? INJECTION RADIOACTIVE TRACER FOR SENTINEL NODE IDENTIFICATION Left 12/09/2018    Performed by Neldon Mc, DO at [facility]   ? LEFT SENTINEL LYMPH NODE BIOPSY Left 12/09/2018    Performed by Neldon Mc, DO at [facility]   ? RECONSTRUCTION BREAST WITH SIENTRA LPP-FH15S 600-720 FILLED TO 350 CC TISSUE EXPANDER- IMMEDIATE Left 12/09/2018    Performed by Corliss Skains, MD at [facility]   ? IMPLANTATION ALLODERM EXTRA LARGE PERFORATED CONTOUR BIOLOGIC IMPLANT FOR SOFT TISSUE REINFORCEMENT Left 12/09/2018    Performed by Corliss Skains, MD at [facility]   ? INTRAVENOUS INJECTION AGENT TO TEST VASCULAR FLOW IN FLAP/ GRAFT Left 12/09/2018  Performed by Corliss Skains, MD at [facility]   ? REPLACEMENT TISSUE EXPANDER WITH PERMANENT PROSTHESIS-Left breast tissue expander removal, capsulotomy, placement implant and right balancing mastopexy and fat grafting from abdomen to breasts Left 01/03/2020    Performed by Corliss Skains, MD at [facility] OR   ? REVISION PERI-IMPLANTCAPSULE BREAST WITH CAPSULOTOMY/ CAPSULORRHAPHY/ PARTIAL CAPSULECTOMY Left 01/03/2020    Performed by Corliss Skains, MD at [facility] OR   ? MASTOPEXY Right 01/03/2020    Performed by Corliss Skains, MD at [facility] OR   ? GRAFTING AUTOLOGOUS FAT BY LIPOSUCTION TO TRUNK/ BREASTS/ SCALP/ ARMS/ LEGS - 50 CC OR LESS Bilateral 01/03/2020    Performed by Corliss Skains, MD at [facility] OR   ? GRAFTING AUTOLOGOUS FAT BY LIPOSUCTION TO TRUNK/ BREASTS/ SCALP/ ARMS/ LEGS - EACH ADDITIONAL 50 CC Bilateral 01/03/2020    Performed by Corliss Skains, MD at [facility] OR   ? REMOVAL TUNNELED CENTRAL VENOUS ACCESS DEVICE INCLUDING PORT/ PUMP Right 01/03/2020    Performed by Corliss Skains, MD at [facility] OR   ? RECONSTRUCTION BREAST WITH FREE FLAP-STACKED PAP FLAPS FOR LEFT BREAST RECONSTRUCTION Left 01/15/2022    Performed by Reyne Dumas, MD at [facility] OR   ? INTRAVENOUS INJECTION AGENT TO TEST VASCULAR FLOW IN FLAP/ GRAFT Left 01/15/2022    Performed by Reyne Dumas, MD at [facility] OR   ? PERI-IMPLANT CAPSULECTOMY BREAST - COMPLETE, INCLUDING REMOVAL ALL INTRACAPSULAR CONTENTS Left 01/15/2022    Performed by Reyne Dumas, MD at [facility] OR   ? INCISION AND DRAINAGE HEMATOMA/ SEROMA - LEFT BREAST FLAP Left 02/01/2022    Performed by Reyne Dumas, MD at [facility] OR   ? HX COSMETIC SURGERY  2001    Breast reduction-   ? HX TUBAL LIGATION  2001     Family History   Problem Relation Age of Onset   ? Unknown to Patient Mother    ? Heart Disease Father    ? High Cholesterol Father    ? Seizures Brother    ? Arthritis-rheumatoid Maternal Aunt    ? Cancer Maternal Uncle         non Hodgkins   ? Arthritis-rheumatoid Maternal Grandmother    ? Cancer-Breast Other 82        mat great gma   ? Heart Disease Other    ? Arthritis-rheumatoid Other    ? High Cholesterol Other      Social History     Socioeconomic History   ? Marital status: Married   Tobacco Use   ? Smoking status: Never     Passive exposure: Current   ? Smokeless tobacco: Never   ? Tobacco comments:     I have nevwr smoked but my husband does!   Substance and Sexual Activity   ? Alcohol use: Never   ? Drug use: Never        Vaping/E-liquid Substances   ? CBD No    ? Nicotine No    ? THC No                Objective:         ? albuterol sulfate (PROAIR HFA) 90 mcg/actuation HFA aerosol inhaler Inhale two puffs by mouth into the lungs every 6 hours as needed for Wheezing or Shortness of Breath.   ? anastrozole (ARIMIDEX) 1 mg tablet Take one tablet by mouth at bedtime daily.   ? calcium carbonate (OS-CAL) 1250 mg (  500 mg elemental calcium) tablet Take one tablet by mouth at bedtime daily.   ? CHOLEcalciferoL (vitamin D3) (DIALYVITE VITAMIN D) 125 mcg (5,000 unit) capsule Take one capsule by mouth at bedtime daily.   ? cyclobenzaprine (FLEXERIL) 5 mg tablet Take one tablet by mouth three times daily.   ? fluticasone propionate (FLONASE) 50 mcg/actuation nasal spray, suspension Apply one spray to each nostril as directed daily as needed. Indications: inflammation of the nose due to an allergy   ? gabapentin (NEURONTIN) 100 mg capsule Take two capsules by mouth every 8 hours. (Patient taking differently: Take two capsules by mouth twice daily.)   ? HYDROcodone/acetaminophen (NORCO) 5/325 mg tablet Take one tablet by mouth every 6 hours as needed for Pain.   ? krill-om-3-dha-epa-phospho-ast (MAXIMUM RED KRILL OMEGA-3) 300-90-27-45 mg cap Take 1 capsule by mouth at bedtime daily.   ? lisinopriL (ZESTRIL) 5 mg tablet Take one tablet by mouth at bedtime daily.   ? magnesium citrate 200 mg tab Take two tablets by mouth at bedtime daily.   ? multivit-min-iron-FA-lutein (CENTRUM SILVER WOMEN) 8 mg iron-400 mcg-300 mcg tab Take 1 tablet by mouth at bedtime daily.   ? pantoprazole DR (PROTONIX) 20 mg tablet Take one tablet by mouth at bedtime as needed. Indications: heartburn   ? silver sulfADIAZINE (SILVADENE) 1 % topical cream Apply one g topically to affected area daily.   ? traZODone (DESYREL) 50 mg tablet Take one tablet by mouth at bedtime daily.     Vitals:    02/18/22 0920   BP: (!) 149/83   BP Source: Arm, Right Upper   Pulse: 74   Temp: 36.6 ?C (97.9 ?F)   SpO2: 98%   TempSrc: Temporal   PainSc: Zero   Weight: 81.6 kg (180 lb)   Height: 165.1 cm (5' 5)     Body mass index is 29.95 kg/m?Marland Kitchen     Physical Exam  Vitals reviewed.   Constitutional:       Appearance: Normal appearance. She is well-groomed.       HENT:      Head: Normocephalic.   Pulmonary:      Effort: Pulmonary effort is normal.   Chest:      Comments: Left breast stacked PAP flaps healing appropriately. Left lateral firm area palpable.   Skin:     General: Skin is warm and dry.   Neurological:      Mental Status: She is alert.   Psychiatric:         Mood and Affect: Mood normal.         Behavior: Behavior normal. Behavior is cooperative.         Thought Content: Thought content normal.         Judgment: Judgment normal.          Assessment and Plan:  Patient s/p left breast stacked pap flaps. Left breast washout.   Santyl to left posterior thigh.     Continue compression as tolerated.  RTC in two weeks.

## 2022-02-25 ENCOUNTER — Encounter: Admit: 2022-02-25 | Discharge: 2022-02-25 | Payer: Private Health Insurance - Indemnity | Primary: Family

## 2022-02-25 ENCOUNTER — Ambulatory Visit: Admit: 2022-02-25 | Discharge: 2022-02-26 | Payer: Private Health Insurance - Indemnity | Primary: Family

## 2022-02-25 DIAGNOSIS — M1990 Unspecified osteoarthritis, unspecified site: Secondary | ICD-10-CM

## 2022-02-25 DIAGNOSIS — Z9889 Other specified postprocedural states: Secondary | ICD-10-CM

## 2022-02-25 DIAGNOSIS — C50919 Malignant neoplasm of unspecified site of unspecified female breast: Secondary | ICD-10-CM

## 2022-02-25 DIAGNOSIS — G4700 Insomnia, unspecified: Secondary | ICD-10-CM

## 2022-02-25 DIAGNOSIS — K219 Gastro-esophageal reflux disease without esophagitis: Secondary | ICD-10-CM

## 2022-02-25 DIAGNOSIS — C50112 Malignant neoplasm of central portion of left female breast: Secondary | ICD-10-CM

## 2022-02-25 DIAGNOSIS — J302 Other seasonal allergic rhinitis: Secondary | ICD-10-CM

## 2022-02-25 MED ORDER — HONEY 100 % TP PSTE
5 mL | Freq: Every day | TOPICAL | 1 refills | 50.00000 days | Status: DC
Start: 2022-02-25 — End: 2022-02-25

## 2022-02-25 MED ORDER — HONEY 100 % TP PSTE
5 mL | Freq: Every day | TOPICAL | 1 refills | 50.00000 days | Status: AC
Start: 2022-02-25 — End: ?

## 2022-02-25 NOTE — Progress Notes
Date of Service: 02/25/2022    Subjective:             Patricia Montes is a 56 y.o. female.   Cancer Staging   Malignant neoplasm of central portion of left breast in female, estrogen receptor positive   Staging form: Breast, AJCC 8th Edition  - Clinical stage from 06/15/2018: Stage IA (cT1c, cN0, cM0, G3, ER+, PR+, HER2+) - Signed by Andrew Au, PA-C on 07/13/2018  - Pathologic: No Stage Recommended (ypT2, pN1, cM0, G3, ER+, PR+, HER2+) - Signed by Neldon Mc, DO on 12/17/2018    History of Present Illness  Patient returns to clinic postoperatively. She has been applying silvadene to her left posterior thigh - PAP donor site. She notes discomfort with sitting and feels that her wound healing has stalled. She has been using Santyl for the last week.     Allergies   Allergen Reactions   ? Bactrim [Sulfamethoxazole-Trimethoprim] HIVES   ? Triple Antibiotic [Neomy-Bacit-Polymyx-Pramoxine] SEE COMMENTS     Petroleum based products result in staph infection  Pt has issues with Vaseline and blistex as well   ? Petroleum Distillates SEE COMMENTS     Petroleum based products result in staph infections per pt          Review of Systems   Constitutional: Negative.    HENT: Negative.    Eyes: Negative.    Respiratory: Negative.    Cardiovascular: Negative.    Gastrointestinal: Negative.    Endocrine: Negative.    Genitourinary: Negative.    Musculoskeletal: Negative.    Skin: Positive for wound.   Allergic/Immunologic: Negative.    Neurological: Negative.    Hematological: Negative.    Psychiatric/Behavioral: Negative.    All other systems reviewed and are negative.    Medical History:   Diagnosis Date   ? Breast cancer (HCC) 06/15/2018    left IDC   ? GERD (gastroesophageal reflux disease)    ? Insomnia    ? Osteoarthritis    ? Seasonal allergies      Surgical History:   Procedure Laterality Date   ? CLAVICLE SURGERY Left 1991   ? HX BREAST REDUCTION Bilateral 2002    with abdominoplasty   ? HX HYSTERECTOMY  2012   ? PLACEMENT OF PORT-A-CATH 8 FRENCH SINGLE-LUMEN POWERPORT Right 07/15/2018    Performed by Dellia Cloud, DO at [facility]   ? FLUOROSCOPIC GUIDANCE CENTRAL VENOUS ACCESS DEVICE PLACEMENT Right 07/15/2018    Performed by Dellia Cloud, DO at [facility]   ? TUNNELED VENOUS PORT PLACEMENT Left 07/22/2018   ? LEFT SKIN SPARING MASTECTOMY Left 12/09/2018    Performed by Neldon Mc, DO at [facility]   ? IDENTIFICATION SENTINEL LYMPH NODE Left 12/09/2018    Performed by Neldon Mc, DO at [facility]   ? INJECTION RADIOACTIVE TRACER FOR SENTINEL NODE IDENTIFICATION Left 12/09/2018    Performed by Neldon Mc, DO at [facility]   ? LEFT SENTINEL LYMPH NODE BIOPSY Left 12/09/2018    Performed by Neldon Mc, DO at [facility]   ? RECONSTRUCTION BREAST WITH SIENTRA LPP-FH15S 600-720 FILLED TO 350 CC TISSUE EXPANDER- IMMEDIATE Left 12/09/2018    Performed by Corliss Skains, MD at [facility] OR   ? IMPLANTATION ALLODERM EXTRA LARGE PERFORATED CONTOUR BIOLOGIC IMPLANT FOR SOFT TISSUE REINFORCEMENT Left 12/09/2018    Performed by Corliss Skains, MD at [facility]   ? INTRAVENOUS INJECTION AGENT TO TEST  VASCULAR FLOW IN FLAP/ GRAFT Left 12/09/2018    Performed by Corliss Skains, MD at [facility] OR   ? REPLACEMENT TISSUE EXPANDER WITH PERMANENT PROSTHESIS-Left breast tissue expander removal, capsulotomy, placement implant and right balancing mastopexy and fat grafting from abdomen to breasts Left 01/03/2020    Performed by Corliss Skains, MD at [facility] OR   ? REVISION PERI-IMPLANTCAPSULE BREAST WITH CAPSULOTOMY/ CAPSULORRHAPHY/ PARTIAL CAPSULECTOMY Left 01/03/2020    Performed by Corliss Skains, MD at [facility] OR   ? MASTOPEXY Right 01/03/2020    Performed by Corliss Skains, MD at [facility] OR   ? GRAFTING AUTOLOGOUS FAT BY LIPOSUCTION TO TRUNK/ BREASTS/ SCALP/ ARMS/ LEGS - 50 CC OR LESS Bilateral 01/03/2020    Performed by Corliss Skains, MD at [facility] OR   ? GRAFTING AUTOLOGOUS FAT BY LIPOSUCTION TO TRUNK/ BREASTS/ SCALP/ ARMS/ LEGS - EACH ADDITIONAL 50 CC Bilateral 01/03/2020    Performed by Corliss Skains, MD at [facility] OR   ? REMOVAL TUNNELED CENTRAL VENOUS ACCESS DEVICE INCLUDING PORT/ PUMP Right 01/03/2020    Performed by Corliss Skains, MD at [facility] OR   ? RECONSTRUCTION BREAST WITH FREE FLAP-STACKED PAP FLAPS FOR LEFT BREAST RECONSTRUCTION Left 01/15/2022    Performed by Reyne Dumas, MD at [facility] OR   ? INTRAVENOUS INJECTION AGENT TO TEST VASCULAR FLOW IN FLAP/ GRAFT Left 01/15/2022    Performed by Reyne Dumas, MD at [facility] OR   ? PERI-IMPLANT CAPSULECTOMY BREAST - COMPLETE, INCLUDING REMOVAL ALL INTRACAPSULAR CONTENTS Left 01/15/2022    Performed by Reyne Dumas, MD at [facility] OR   ? INCISION AND DRAINAGE HEMATOMA/ SEROMA - LEFT BREAST FLAP Left 02/01/2022    Performed by Reyne Dumas, MD at [facility] OR   ? HX COSMETIC SURGERY  2001    Breast reduction-   ? HX TUBAL LIGATION  2001     Family History   Problem Relation Age of Onset   ? Unknown to Patient Mother    ? Heart Disease Father    ? High Cholesterol Father    ? Seizures Brother    ? Arthritis-rheumatoid Maternal Aunt    ? Cancer Maternal Uncle         non Hodgkins   ? Arthritis-rheumatoid Maternal Grandmother    ? Cancer-Breast Other 82        mat great gma   ? Heart Disease Other    ? Arthritis-rheumatoid Other    ? High Cholesterol Other      Social History     Socioeconomic History   ? Marital status: Married   Tobacco Use   ? Smoking status: Never     Passive exposure: Current   ? Smokeless tobacco: Never   ? Tobacco comments:     I have nevwr smoked but my husband does!   Substance and Sexual Activity   ? Alcohol use: Never   ? Drug use: Never        Vaping/E-liquid Substances   ? CBD No    ? Nicotine No    ? THC No                Objective:         ? albuterol sulfate (PROAIR HFA) 90 mcg/actuation HFA aerosol inhaler Inhale two puffs by mouth into the lungs every 6 hours as needed for Wheezing or Shortness of Breath.   ? anastrozole (ARIMIDEX) 1 mg tablet Take one tablet by mouth at  bedtime daily.   ? calcium carbonate (OS-CAL) 1250 mg (500 mg elemental calcium) tablet Take one tablet by mouth at bedtime daily.   ? CHOLEcalciferoL (vitamin D3) (DIALYVITE VITAMIN D) 125 mcg (5,000 unit) capsule Take one capsule by mouth at bedtime daily.   ? collagenase clostridium histo. (SANTYL) 250 unit/g topical ointment Apply one g topically to affected area daily. Indications: Apply to thigh dehiscence daily   ? cyclobenzaprine (FLEXERIL) 5 mg tablet Take one tablet by mouth three times daily.   ? fluticasone propionate (FLONASE) 50 mcg/actuation nasal spray, suspension Apply one spray to each nostril as directed daily as needed. Indications: inflammation of the nose due to an allergy   ? gabapentin (NEURONTIN) 100 mg capsule Take two capsules by mouth every 8 hours. (Patient taking differently: Take two capsules by mouth twice daily.)   ? honey (MEDIHONEY) 100 % topical paste Apply 5 mL topically to affected area daily.   ? krill-om-3-dha-epa-phospho-ast (MAXIMUM RED KRILL OMEGA-3) 300-90-27-45 mg cap Take 1 capsule by mouth at bedtime daily.   ? lisinopriL (ZESTRIL) 5 mg tablet Take one tablet by mouth at bedtime daily.   ? magnesium citrate 200 mg tab Take two tablets by mouth at bedtime daily.   ? multivit-min-iron-FA-lutein (CENTRUM SILVER WOMEN) 8 mg iron-400 mcg-300 mcg tab Take 1 tablet by mouth at bedtime daily.   ? pantoprazole DR (PROTONIX) 20 mg tablet Take one tablet by mouth at bedtime as needed. Indications: heartburn   ? traZODone (DESYREL) 50 mg tablet Take one tablet by mouth at bedtime daily.     Vitals:    02/25/22 0920   BP: 135/87   Pulse: 77   Temp: 37.2 ?C (99 ?F)   TempSrc: Skin   PainSc: Zero   Weight: 81.6 kg (180 lb)   Height: 165.1 cm (5' 5)     Body mass index is 29.95 kg/m?Marland Kitchen     Physical Exam  Vitals reviewed.   Constitutional:       Appearance: Normal appearance. She is well-groomed.       HENT:      Head: Normocephalic.   Pulmonary:      Effort: Pulmonary effort is normal.   Chest:      Comments: Left breast stacked PAP flaps healing appropriately. Left lateral firm area palpable.   Skin:     General: Skin is warm and dry.   Neurological:      Mental Status: She is alert.   Psychiatric:         Mood and Affect: Mood normal.         Behavior: Behavior normal. Behavior is cooperative.         Thought Content: Thought content normal.         Judgment: Judgment normal.          Assessment and Plan:  Patient s/p left breast stacked pap flaps. Left breast washout.   Medihoney to left posterior thigh.     Continue compression as tolerated.  RTC in next week.

## 2022-03-11 ENCOUNTER — Encounter: Admit: 2022-03-11 | Discharge: 2022-03-11 | Payer: Private Health Insurance - Indemnity | Primary: Family

## 2022-03-27 ENCOUNTER — Encounter: Admit: 2022-03-27 | Discharge: 2022-03-27 | Payer: Private Health Insurance - Indemnity | Primary: Family

## 2022-03-27 ENCOUNTER — Ambulatory Visit: Admit: 2022-03-27 | Discharge: 2022-03-27 | Payer: Private Health Insurance - Indemnity | Primary: Family

## 2022-03-27 DIAGNOSIS — K219 Gastro-esophageal reflux disease without esophagitis: Secondary | ICD-10-CM

## 2022-03-27 DIAGNOSIS — Z9889 Other specified postprocedural states: Secondary | ICD-10-CM

## 2022-03-27 DIAGNOSIS — N650 Deformity of reconstructed breast: Secondary | ICD-10-CM

## 2022-03-27 DIAGNOSIS — G4700 Insomnia, unspecified: Secondary | ICD-10-CM

## 2022-03-27 DIAGNOSIS — M1990 Unspecified osteoarthritis, unspecified site: Secondary | ICD-10-CM

## 2022-03-27 DIAGNOSIS — C50919 Malignant neoplasm of unspecified site of unspecified female breast: Secondary | ICD-10-CM

## 2022-03-27 DIAGNOSIS — J302 Other seasonal allergic rhinitis: Secondary | ICD-10-CM

## 2022-03-27 MED ORDER — HONEY 100 % TP PSTE
5 mL | Freq: Every day | TOPICAL | 1 refills | 50.00000 days | Status: AC
Start: 2022-03-27 — End: ?

## 2022-03-27 NOTE — Progress Notes
Date of Service: 03/27/2022    Subjective:             Patricia Montes is a 57 y.o. female.   Cancer Staging   Malignant neoplasm of central portion of left breast in female, estrogen receptor positive   Staging form: Breast, AJCC 8th Edition  - Clinical stage from 06/15/2018: Stage IA (cT1c, cN0, cM0, G3, ER+, PR+, HER2+) - Signed by Andrew Au, PA-C on 07/13/2018  - Pathologic: No Stage Recommended (ypT2, pN1, cM0, G3, ER+, PR+, HER2+) - Signed by Neldon Mc, DO on 12/17/2018    History of Present Illness  Patient returns to clinic for left leg delayed wound. She has been applying medihoney and Santyl on the left posterior thigh wound. She can feel multiple areas of suture.    Allergies   Allergen Reactions    Bactrim [Sulfamethoxazole-Trimethoprim] HIVES    Triple Antibiotic [Neomy-Bacit-Polymyx-Pramoxine] SEE COMMENTS     Petroleum based products result in staph infection  Pt has issues with Vaseline and blistex as well    Petroleum Distillates SEE COMMENTS     Petroleum based products result in staph infections per pt         Review of Systems   All other systems reviewed and are negative.    Medical History:   Diagnosis Date    Breast cancer (HCC) 06/15/2018    left IDC    GERD (gastroesophageal reflux disease)     Insomnia     Osteoarthritis     Seasonal allergies      Surgical History:   Procedure Laterality Date    CLAVICLE SURGERY Left 1991    HX BREAST REDUCTION Bilateral 2002    with abdominoplasty    HX HYSTERECTOMY  2012    PLACEMENT OF PORT-A-CATH 8 FRENCH SINGLE-LUMEN POWERPORT Right 07/15/2018    Performed by Dellia Cloud, DO at [facility]    FLUOROSCOPIC GUIDANCE CENTRAL VENOUS ACCESS DEVICE PLACEMENT Right 07/15/2018    Performed by Dellia Cloud, DO at [facility]    TUNNELED VENOUS PORT PLACEMENT Left 07/22/2018    LEFT SKIN SPARING MASTECTOMY Left 12/09/2018    Performed by Neldon Mc, DO at [facility]    IDENTIFICATION SENTINEL LYMPH NODE Left 12/09/2018    Performed by Neldon Mc, DO at [facility]    INJECTION RADIOACTIVE TRACER FOR SENTINEL NODE IDENTIFICATION Left 12/09/2018    Performed by Neldon Mc, DO at [facility]    LEFT SENTINEL LYMPH NODE BIOPSY Left 12/09/2018    Performed by Neldon Mc, DO at [facility]    RECONSTRUCTION BREAST WITH SIENTRA LPP-FH15S 600-720 FILLED TO 350 CC TISSUE EXPANDER- IMMEDIATE Left 12/09/2018    Performed by Corliss Skains, MD at [facility]    IMPLANTATION ALLODERM EXTRA LARGE PERFORATED CONTOUR BIOLOGIC IMPLANT FOR SOFT TISSUE REINFORCEMENT Left 12/09/2018    Performed by Corliss Skains, MD at [facility]    INTRAVENOUS INJECTION AGENT TO TEST VASCULAR FLOW IN FLAP/ GRAFT Left 12/09/2018    Performed by Corliss Skains, MD at [facility]    REPLACEMENT TISSUE EXPANDER WITH PERMANENT PROSTHESIS-Left breast tissue expander removal, capsulotomy, placement implant and right balancing mastopexy and fat grafting from abdomen to breasts Left 01/03/2020    Performed by Corliss Skains, MD at [facility] OR    REVISION PERI-IMPLANTCAPSULE BREAST WITH CAPSULOTOMY/ CAPSULORRHAPHY/ PARTIAL CAPSULECTOMY Left 01/03/2020    Performed by Corliss Skains, MD at [facility] OR  MASTOPEXY Right 01/03/2020    Performed by Corliss Skains, MD at [facility] OR    GRAFTING AUTOLOGOUS FAT BY LIPOSUCTION TO TRUNK/ BREASTS/ SCALP/ ARMS/ LEGS - 50 CC OR LESS Bilateral 01/03/2020    Performed by Corliss Skains, MD at [facility] OR    GRAFTING AUTOLOGOUS FAT BY LIPOSUCTION TO TRUNK/ BREASTS/ SCALP/ ARMS/ LEGS - EACH ADDITIONAL 50 CC Bilateral 01/03/2020    Performed by Corliss Skains, MD at [facility] OR    REMOVAL TUNNELED CENTRAL VENOUS ACCESS DEVICE INCLUDING PORT/ PUMP Right 01/03/2020    Performed by Corliss Skains, MD at [facility] OR    RECONSTRUCTION BREAST WITH FREE FLAP-STACKED PAP FLAPS FOR LEFT BREAST RECONSTRUCTION Left 01/15/2022    Performed by Reyne Dumas, MD at [facility] OR    INTRAVENOUS INJECTION AGENT TO TEST VASCULAR FLOW IN FLAP/ GRAFT Left 01/15/2022 Performed by Reyne Dumas, MD at [facility] OR    PERI-IMPLANT CAPSULECTOMY BREAST - COMPLETE, INCLUDING REMOVAL ALL INTRACAPSULAR CONTENTS Left 01/15/2022    Performed by Reyne Dumas, MD at [facility] OR    INCISION AND DRAINAGE HEMATOMA/ SEROMA - LEFT BREAST FLAP Left 02/01/2022    Performed by Reyne Dumas, MD at [facility] OR    HX COSMETIC SURGERY  2001    Breast reduction-    HX TUBAL LIGATION  2001     Family History   Problem Relation Age of Onset    Unknown to Patient Mother     Heart Disease Father     High Cholesterol Father     Seizures Brother     Arthritis-rheumatoid Maternal Aunt     Cancer Maternal Uncle         non Hodgkins    Arthritis-rheumatoid Maternal Grandmother     Cancer-Breast Other 82        mat great gma    Heart Disease Other     Arthritis-rheumatoid Other     High Cholesterol Other      Social History     Socioeconomic History    Marital status: Married   Tobacco Use    Smoking status: Never     Passive exposure: Current    Smokeless tobacco: Never    Tobacco comments:     I have nevwr smoked but my husband does!   Substance and Sexual Activity    Alcohol use: Never    Drug use: Never        Vaping/E-liquid Substances    CBD No     Nicotine No     THC No                Objective:          albuterol sulfate (PROAIR HFA) 90 mcg/actuation HFA aerosol inhaler Inhale two puffs by mouth into the lungs every 6 hours as needed for Wheezing or Shortness of Breath.    anastrozole (ARIMIDEX) 1 mg tablet Take one tablet by mouth at bedtime daily.    calcium carbonate (OS-CAL) 1250 mg (500 mg elemental calcium) tablet Take one tablet by mouth at bedtime daily.    CHOLEcalciferoL (vitamin D3) (DIALYVITE VITAMIN D) 125 mcg (5,000 unit) capsule Take one capsule by mouth at bedtime daily.    collagenase clostridium histo. (SANTYL) 250 unit/g topical ointment Apply one g topically to affected area daily. Indications: Apply to thigh dehiscence daily    cyclobenzaprine (FLEXERIL) 5 mg tablet Take one tablet by mouth three times daily.    fluticasone propionate (  FLONASE) 50 mcg/actuation nasal spray, suspension Apply one spray to each nostril as directed daily as needed. Indications: inflammation of the nose due to an allergy    gabapentin (NEURONTIN) 100 mg capsule Take two capsules by mouth every 8 hours. (Patient taking differently: Take two capsules by mouth twice daily.)    honey (MEDIHONEY) 100 % topical paste Apply 5 mL topically to affected area daily.    krill-om-3-dha-epa-phospho-ast (MAXIMUM RED KRILL OMEGA-3) 300-90-27-45 mg cap Take 1 capsule by mouth at bedtime daily.    lisinopriL (ZESTRIL) 5 mg tablet Take one tablet by mouth at bedtime daily.    magnesium citrate 200 mg tab Take two tablets by mouth at bedtime daily.    multivit-min-iron-FA-lutein (CENTRUM SILVER WOMEN) 8 mg iron-400 mcg-300 mcg tab Take 1 tablet by mouth at bedtime daily.    pantoprazole DR (PROTONIX) 20 mg tablet Take one tablet by mouth at bedtime as needed. Indications: heartburn    traZODone (DESYREL) 50 mg tablet Take one tablet by mouth at bedtime daily.     Vitals:    03/27/22 1459   BP: 127/66   Pulse: 90   Weight: 83.5 kg (184 lb)   Height: 165.1 cm (5' 5)     Body mass index is 30.62 kg/m?Marland Kitchen     Physical Exam  Vitals reviewed.   Constitutional:       Appearance: Normal appearance. She is well-groomed.       HENT:      Head: Normocephalic.   Pulmonary:      Effort: Pulmonary effort is normal.   Chest:      Comments: Left lateral breast firm areas   Skin:     General: Skin is warm and dry.   Neurological:      Mental Status: She is alert.   Psychiatric:         Mood and Affect: Mood normal.         Behavior: Behavior normal. Behavior is cooperative.         Thought Content: Thought content normal.         Judgment: Judgment normal.          Assessment and Plan:  Patient s/p left stacked pap flap.  Spitting sutures removed.  Continue Medihoney - refill sent     RTC in 3 weeks.

## 2022-04-15 ENCOUNTER — Encounter: Admit: 2022-04-15 | Discharge: 2022-04-15 | Payer: Private Health Insurance - Indemnity | Primary: Family

## 2022-04-21 ENCOUNTER — Encounter: Admit: 2022-04-21 | Discharge: 2022-04-21 | Payer: Private Health Insurance - Indemnity | Primary: Family

## 2022-04-22 ENCOUNTER — Encounter: Admit: 2022-04-22 | Discharge: 2022-04-22 | Payer: Private Health Insurance - Indemnity | Primary: Family

## 2022-04-22 ENCOUNTER — Ambulatory Visit: Admit: 2022-04-22 | Discharge: 2022-04-23 | Payer: Private Health Insurance - Indemnity | Primary: Family

## 2022-04-22 DIAGNOSIS — M1990 Unspecified osteoarthritis, unspecified site: Secondary | ICD-10-CM

## 2022-04-22 DIAGNOSIS — K219 Gastro-esophageal reflux disease without esophagitis: Secondary | ICD-10-CM

## 2022-04-22 DIAGNOSIS — G4700 Insomnia, unspecified: Secondary | ICD-10-CM

## 2022-04-22 DIAGNOSIS — J302 Other seasonal allergic rhinitis: Secondary | ICD-10-CM

## 2022-04-22 DIAGNOSIS — Z9889 Other specified postprocedural states: Secondary | ICD-10-CM

## 2022-04-22 DIAGNOSIS — C50919 Malignant neoplasm of unspecified site of unspecified female breast: Secondary | ICD-10-CM

## 2022-04-22 DIAGNOSIS — Z853 Personal history of malignant neoplasm of breast: Secondary | ICD-10-CM

## 2022-04-22 NOTE — Progress Notes
Subjective:       History of Present Illness  Patricia Montes is a 57 y.o. female who returns for follow-up.  Overall she is doing well.  She does complain about soreness posterior to the lateral instead of her profunda artery perforator flaps.  She also has palpable areas of fat necrosis in the upper outer quadrant.  She got an ultrasound which demonstrated fat necrosis as well as a seroma collection at the 3 o'clock position.       Review of Systems   Constitutional: Negative.    HENT: Negative.     Eyes: Negative.    Respiratory: Negative.     Cardiovascular: Negative.    Gastrointestinal: Negative.    Endocrine: Negative.    Genitourinary: Negative.    Musculoskeletal: Negative.    Skin: Negative.    Allergic/Immunologic: Negative.    Neurological: Negative.    Hematological: Negative.    Psychiatric/Behavioral: Negative.         Objective:          albuterol sulfate (PROAIR HFA) 90 mcg/actuation HFA aerosol inhaler Inhale two puffs by mouth into the lungs every 6 hours as needed for Wheezing or Shortness of Breath.    anastrozole (ARIMIDEX) 1 mg tablet Take one tablet by mouth at bedtime daily.    calcium carbonate (OS-CAL) 1250 mg (500 mg elemental calcium) tablet Take one tablet by mouth at bedtime daily.    CHOLEcalciferoL (vitamin D3) (DIALYVITE VITAMIN D) 125 mcg (5,000 unit) capsule Take one capsule by mouth at bedtime daily.    cyclobenzaprine (FLEXERIL) 5 mg tablet Take one tablet by mouth three times daily.    fluticasone propionate (FLONASE) 50 mcg/actuation nasal spray, suspension Apply one spray to each nostril as directed daily as needed. Indications: inflammation of the nose due to an allergy    krill-om-3-dha-epa-phospho-ast (MAXIMUM RED KRILL OMEGA-3) 300-90-27-45 mg cap Take 1 capsule by mouth at bedtime daily.    lisinopriL (ZESTRIL) 5 mg tablet Take one tablet by mouth at bedtime daily.    magnesium citrate 200 mg tab Take two tablets by mouth at bedtime daily. multivit-min-iron-FA-lutein (CENTRUM SILVER WOMEN) 8 mg iron-400 mcg-300 mcg tab Take 1 tablet by mouth at bedtime daily.    pantoprazole DR (PROTONIX) 20 mg tablet Take one tablet by mouth at bedtime as needed. Indications: heartburn    traZODone (DESYREL) 50 mg tablet Take one tablet by mouth at bedtime daily.     Vitals:    04/22/22 0835   PainSc: Zero     There is no height or weight on file to calculate BMI.     Physical Exam  She has 2 firm areas of fat necrosis in the upper outer quadrant.  There may be a small fluid collection at the 3 o'clock position.  She has some tissue redundancy posterior to the flap inset.       Assessment and Plan:  She would be interested in revision of the left reconstructed breast.  She feels as though the left side is more wide than her right side.  We would need to narrow the left side as well as lift with excision of some of the mastectomy skin.  In doing that we can go up and get the fat necrosis in the upper outer quadrant.    Plan:  1) revision of left reconstructed breast  2) adjacent tissue transfer with superior advancement    OK to schedule

## 2022-04-23 ENCOUNTER — Encounter: Admit: 2022-04-23 | Discharge: 2022-04-23 | Payer: Private Health Insurance - Indemnity | Primary: Family

## 2022-04-23 ENCOUNTER — Ambulatory Visit: Admit: 2022-04-23 | Discharge: 2022-04-23 | Payer: Private Health Insurance - Indemnity | Primary: Family

## 2022-04-23 DIAGNOSIS — Z9889 Other specified postprocedural states: Secondary | ICD-10-CM

## 2022-04-23 DIAGNOSIS — N650 Deformity of reconstructed breast: Secondary | ICD-10-CM

## 2022-04-23 DIAGNOSIS — Z853 Personal history of malignant neoplasm of breast: Secondary | ICD-10-CM

## 2022-05-09 ENCOUNTER — Encounter: Admit: 2022-05-09 | Discharge: 2022-05-09 | Payer: Private Health Insurance - Indemnity | Primary: Family

## 2022-06-28 ENCOUNTER — Encounter: Admit: 2022-06-28 | Discharge: 2022-06-28 | Payer: Private Health Insurance - Indemnity | Primary: Family

## 2022-10-08 ENCOUNTER — Encounter: Admit: 2022-10-08 | Discharge: 2022-10-08 | Payer: Private Health Insurance - Indemnity | Primary: Family

## 2022-10-21 ENCOUNTER — Encounter: Admit: 2022-10-21 | Discharge: 2022-10-21 | Payer: Private Health Insurance - Indemnity | Primary: Family

## 2022-10-21 ENCOUNTER — Ambulatory Visit: Admit: 2022-10-21 | Discharge: 2022-10-22 | Payer: Private Health Insurance - Indemnity | Primary: Family

## 2022-10-21 DIAGNOSIS — G4700 Insomnia, unspecified: Secondary | ICD-10-CM

## 2022-10-21 DIAGNOSIS — Z9889 Other specified postprocedural states: Secondary | ICD-10-CM

## 2022-10-21 DIAGNOSIS — K219 Gastro-esophageal reflux disease without esophagitis: Secondary | ICD-10-CM

## 2022-10-21 DIAGNOSIS — C50919 Malignant neoplasm of unspecified site of unspecified female breast: Secondary | ICD-10-CM

## 2022-10-21 DIAGNOSIS — M1990 Unspecified osteoarthritis, unspecified site: Secondary | ICD-10-CM

## 2022-10-21 DIAGNOSIS — J302 Other seasonal allergic rhinitis: Secondary | ICD-10-CM

## 2022-10-21 NOTE — Progress Notes
Subjective:       History of Present Illness  Patricia Montes is a 57 y.o. female    The patient, with a history of breast reconstruction, presents with concerns about the appearance and discomfort in her left breast. She describes a knot and a flat spot in her breast, which she feels is wider than the right side. She also reports discomfort when wearing a bra, which she can only tolerate for a few hours.     .       Review of Systems   Constitutional:  Positive for fatigue.   HENT: Negative.     Eyes: Negative.    Respiratory: Negative.     Cardiovascular: Negative.    Gastrointestinal: Negative.    Endocrine: Negative.    Genitourinary: Negative.    Musculoskeletal: Negative.    Skin: Negative.    Allergic/Immunologic: Negative.    Neurological: Negative.    Hematological: Negative.    Psychiatric/Behavioral: Negative.         Objective:          albuterol sulfate (PROAIR HFA) 90 mcg/actuation HFA aerosol inhaler Inhale two puffs by mouth into the lungs every 6 hours as needed for Wheezing or Shortness of Breath.    anastrozole (ARIMIDEX) 1 mg tablet Take one tablet by mouth at bedtime daily.    calcium carbonate (OS-CAL) 1250 mg (500 mg elemental calcium) tablet Take one tablet by mouth at bedtime daily.    CHOLEcalciferoL (vitamin D3) (DIALYVITE VITAMIN D) 125 mcg (5,000 unit) capsule Take one capsule by mouth at bedtime daily.    cyclobenzaprine (FLEXERIL) 5 mg tablet Take one tablet by mouth three times daily.    fluticasone propionate (FLONASE) 50 mcg/actuation nasal spray, suspension Apply one spray to each nostril as directed daily as needed. Indications: inflammation of the nose due to an allergy    krill-om-3-dha-epa-phospho-ast (MAXIMUM RED KRILL OMEGA-3) 300-90-27-45 mg cap Take 1 capsule by mouth at bedtime daily.    lisinopriL (ZESTRIL) 5 mg tablet Take one tablet by mouth at bedtime daily.    magnesium citrate 200 mg tab Take two tablets by mouth at bedtime daily. multivit-min-iron-FA-lutein (CENTRUM SILVER WOMEN) 8 mg iron-400 mcg-300 mcg tab Take 1 tablet by mouth at bedtime daily.    pantoprazole DR (PROTONIX) 20 mg tablet Take one tablet by mouth at bedtime as needed. Indications: heartburn    traZODone (DESYREL) 50 mg tablet Take one tablet by mouth at bedtime daily.     Vitals:    10/21/22 0854   BP: (!) 143/86   Pulse: 74   Temp: 36.7 ?C (98.1 ?F)   PainSc: Zero   Weight: 83.5 kg (184 lb)   Height: 165.1 cm (5' 5)     Body mass index is 30.62 kg/m?Marland Kitchen     Physical Exam      BREAST: Deficiency in center of left breast reconstruction. Thin fluid layer superiorly at 12 o'clock position. Palpable area laterally on left breast. Damaged skin on left breast, possibly related to previous scar. Area softened. No hematoma present.              Assessment and Plan:      Left Breast Reconstruction: Noted firmness and a palpable area laterally. Patient reports discomfort with certain bras due to this firmness. Ultrasound revealed a thin fluid layer superiorly at the 12 o'clock position. Patient perceives left breast as wider than the right.  -Plan for revision surgery on January 29, 2023. This will involve  lifting and narrowing the left breast, excising the area where the fluid is, and potentially performing fat grafting to correct deficiencies. The goal is to make the left breast look similar to the right.    Right Breast: Patient is satisfied with the current state of the right breast post-mastopexia. No changes planned.    Follow-up: Reconnect closer to November to finalize surgical plan.       Plan:  Revision of left reconstructed breast with excision of mastectomy skin and lifting the skin paddle.   Excision of seroma cavity at the 12 o'clock position on the breast reconstruction.  Exceision of excess lateral tissue and fat necrosis  Fat grafting for contour deformity improvement    15 minutes was spent with the patient, greater than 60% of which was counseling.

## 2023-01-07 ENCOUNTER — Encounter: Admit: 2023-01-07 | Discharge: 2023-01-07 | Payer: Private Health Insurance - Indemnity | Primary: Family

## 2023-01-08 ENCOUNTER — Encounter: Admit: 2023-01-08 | Discharge: 2023-01-08 | Payer: Private Health Insurance - Indemnity | Primary: Family

## 2023-01-08 DIAGNOSIS — G4700 Insomnia, unspecified: Secondary | ICD-10-CM

## 2023-01-08 DIAGNOSIS — J302 Other seasonal allergic rhinitis: Secondary | ICD-10-CM

## 2023-01-08 DIAGNOSIS — K929 Disease of digestive system, unspecified: Secondary | ICD-10-CM

## 2023-01-08 DIAGNOSIS — C50919 Malignant neoplasm of unspecified site of unspecified female breast: Secondary | ICD-10-CM

## 2023-01-08 DIAGNOSIS — M1990 Unspecified osteoarthritis, unspecified site: Secondary | ICD-10-CM

## 2023-01-16 ENCOUNTER — Encounter: Admit: 2023-01-16 | Discharge: 2023-01-16 | Payer: Private Health Insurance - Indemnity | Primary: Family

## 2023-01-16 NOTE — Telephone Encounter
Second attempt to reach patient to discuss surgery on 01/31/23 and potential of moving forward without fat grafting.  Lvm with return contact information and request for call back.

## 2023-01-20 ENCOUNTER — Encounter: Admit: 2023-01-20 | Discharge: 2023-01-20 | Payer: Private Health Insurance - Indemnity | Primary: Family

## 2023-01-20 ENCOUNTER — Ambulatory Visit: Admit: 2023-01-20 | Discharge: 2023-01-21 | Payer: Private Health Insurance - Indemnity | Primary: Family

## 2023-01-20 DIAGNOSIS — Z853 Personal history of malignant neoplasm of breast: Secondary | ICD-10-CM

## 2023-01-20 DIAGNOSIS — N650 Deformity of reconstructed breast: Secondary | ICD-10-CM

## 2023-01-20 MED ORDER — TRAMADOL 50 MG PO TAB
50 mg | ORAL_TABLET | ORAL | 0 refills | Status: AC | PRN
Start: 2023-01-20 — End: ?
  Filled 2023-01-20: qty 15, 5d supply, fill #1

## 2023-01-20 NOTE — Progress Notes
Date of Service: 01/20/2023    Subjective:             Patricia Montes is a 57 y.o. female.    History of Present Illness     The patient, with a history of breast reconstruction, presents with concerns about the appearance of her left breast. She reports that it appears smaller and flatter than the right one, particularly when she is wearing a bra. She also notes a crease on the left side that is visible when she wears certain clothing. The patient does not report any discomfort or other symptoms related to the breast. She has not undergone any recent changes in her treatment or medication regimen.                  Objective:         albuterol sulfate (PROAIR HFA) 90 mcg/actuation HFA aerosol inhaler Inhale two puffs by mouth into the lungs every 6 hours as needed for Wheezing or Shortness of Breath.    anastrozole (ARIMIDEX) 1 mg tablet Take one tablet by mouth at bedtime daily.    ascorbic acid (VITAMIN C PO) Take 1 tablet by mouth daily.    calcium carbonate (OS-CAL) 1250 mg (500 mg elemental calcium) tablet Take one tablet by mouth at bedtime daily.    CHOLEcalciferoL (vitamin D3) (DIALYVITE VITAMIN D) 125 mcg (5,000 unit) capsule Take one capsule by mouth at bedtime daily. With Vitamin K2    cyclobenzaprine (FLEXERIL) 5 mg tablet Take one tablet by mouth three times daily. (Patient not taking: Reported on 01/08/2023)    DIETARY SUPPLEMENT,MISC COMB14 PO Take 2 Gummy by mouth daily. Balance of nature; 1 fruits and 1 veggies    flaxseed oil (OMEGA 3 PO) Take 1 capsule by mouth daily.    fluticasone propionate (FLONASE) 50 mcg/actuation nasal spray, suspension Apply one spray to each nostril as directed daily as needed. Indications: inflammation of the nose due to an allergy    HAIR, SKIN AND NAILS (BIOTIN) PO Take 1 tablet by mouth daily.    L.acid/L.casei/B.bif/B.lon/FOS (PROBIOTIC BLEND PO) Take 1 capsule by mouth daily.    lisinopriL (ZESTRIL) 5 mg tablet Take one tablet by mouth at bedtime daily. magnesium citrate 200 mg tab Take two tablets by mouth at bedtime daily.    multivit-min-iron-FA-lutein (CENTRUM SILVER WOMEN) 8 mg iron-400 mcg-300 mcg tab Take 1 tablet by mouth at bedtime daily.    pantoprazole DR (PROTONIX) 20 mg tablet Take one tablet by mouth at bedtime as needed. Indications: heartburn    traMADoL (ULTRAM) 50 mg tablet Take one tablet by mouth every 8 hours as needed for Pain.    traZODone (DESYREL) 50 mg tablet Take one tablet by mouth at bedtime daily.     Vitals:    01/20/23 0844   BP: 131/85   Pulse: 79   Temp: 36.8 ?C (98.2 ?F)   TempSrc: Temporal   PainSc: Zero   Weight: 75.8 kg (167 lb 3.2 oz)   Height: 165.1 cm (5' 5)     Body mass index is 27.82 kg/m?Marland Kitchen     Physical Exam      BREAST: Asymmetry with decreased volume on the left side compared to the right. Crease present on the left side.              Assessment and Plan:      Asymmetry post-breast reconstruction  Left breast has less volume and a lower position compared to the right. The  patient reports discomfort with the appearance of the left breast in certain bras. Discussed the possibility of fat grafting to increase volume, but currently unavailable due to a national fluid shortage.  -Plan to perform a revision of the left breast reconstruction on January 29, 2023. This will involve removing skin and lifting the breast to improve symmetry.  -Consider future fat grafting when resources become available to further improve volume and symmetry.       Operation: Revision of left reconstructed breast.  We will excise the mastectomy skin below the crease, raised the upper scar line, mobilized the flap and redraped the mastectomy skin.  Codes: 47829 (revision)  Surgical time (hours): 1.5  Anesthetic: General  Position: Supine  Implants: none  Tissue/Mesh: None  Special Equipment: None  Admission:  Outpatient             Informed consent:  The risks, benefits, goals, and alternatives were reviewed, as were blood transfusions. advanced directives, and team approach.  The patient is in full understanding and agrees to proceed with the aforementioned procedure.    10 minutes was spent with the patient, greater than 60% of which was counseling.

## 2023-01-29 ENCOUNTER — Encounter: Admit: 2023-01-29 | Discharge: 2023-01-29 | Payer: Private Health Insurance - Indemnity | Primary: Family

## 2023-01-29 NOTE — Telephone Encounter
Called patient after receiving voicemail stating she needs to postpone surgery, currently scheduled for Friday 01/31/23.  Stated she became ill on Saturday and has now developed a fever.  Left voicemail for patient stating we will cancel surgery and post operative appointment and will reschedule after she is feeling better.  Requested patient return call to confirm she received voicemail.    Case message sent to OR, Post op cancelled, Dr. Kathaleen Bury updated.

## 2023-01-30 ENCOUNTER — Encounter: Admit: 2023-01-30 | Discharge: 2023-01-30 | Payer: Private Health Insurance - Indemnity | Primary: Family

## 2023-02-11 ENCOUNTER — Encounter: Admit: 2023-02-11 | Discharge: 2023-02-11 | Payer: Private Health Insurance - Indemnity | Primary: Family

## 2023-02-18 ENCOUNTER — Encounter: Admit: 2023-02-18 | Discharge: 2023-02-18 | Payer: Private Health Insurance - Indemnity | Primary: Family

## 2023-02-18 NOTE — Telephone Encounter
Lvm for patient to discuss surgery rescheduling. Return contact information provided with request for call back.

## 2023-04-21 ENCOUNTER — Encounter: Admit: 2023-04-21 | Discharge: 2023-04-21 | Payer: Private Health Insurance - Indemnity | Primary: Family

## 2023-05-02 ENCOUNTER — Encounter: Admit: 2023-05-02 | Discharge: 2023-05-02 | Payer: Private Health Insurance - Indemnity | Primary: Family

## 2024-02-23 ENCOUNTER — Encounter: Admit: 2024-02-23 | Discharge: 2024-02-23 | Payer: PRIVATE HEALTH INSURANCE | Primary: Family
